# Patient Record
Sex: Female | Born: 1995 | Race: Black or African American | Hispanic: No | Marital: Single | Smoking: Current every day smoker
Health system: Southern US, Community
[De-identification: ages and names within clinical notes are randomized; demographics above are authoritative.]

## PROBLEM LIST (undated history)

## (undated) DIAGNOSIS — J45909 Unspecified asthma, uncomplicated: Secondary | ICD-10-CM

## (undated) HISTORY — PX: APPENDECTOMY: SHX54

---

## 2015-02-15 ENCOUNTER — Emergency Department (HOSPITAL_COMMUNITY): Payer: Medicaid Other | Admitting: Anesthesiology

## 2015-02-15 ENCOUNTER — Inpatient Hospital Stay (HOSPITAL_BASED_OUTPATIENT_CLINIC_OR_DEPARTMENT_OTHER)
Admission: EM | Admit: 2015-02-15 | Discharge: 2015-02-20 | DRG: 339 | Disposition: A | Discharge status: Home / Self Care | Payer: Medicaid Other | Attending: Surgery | Admitting: Surgery

## 2015-02-15 ENCOUNTER — Encounter (HOSPITAL_COMMUNITY): Admission: EM | Disposition: A | Discharge status: Home / Self Care | Payer: Self-pay | Source: Home / Self Care

## 2015-02-15 ENCOUNTER — Encounter (HOSPITAL_BASED_OUTPATIENT_CLINIC_OR_DEPARTMENT_OTHER): Payer: Self-pay | Admitting: *Deleted

## 2015-02-15 ENCOUNTER — Emergency Department (HOSPITAL_BASED_OUTPATIENT_CLINIC_OR_DEPARTMENT_OTHER): Payer: Medicaid Other

## 2015-02-15 DIAGNOSIS — K353 Acute appendicitis with localized peritonitis: Principal | ICD-10-CM | POA: Diagnosis present

## 2015-02-15 DIAGNOSIS — K3532 Acute appendicitis with perforation and localized peritonitis, without abscess: Secondary | ICD-10-CM | POA: Diagnosis present

## 2015-02-15 DIAGNOSIS — D72829 Elevated white blood cell count, unspecified: Secondary | ICD-10-CM

## 2015-02-15 DIAGNOSIS — K567 Ileus, unspecified: Secondary | ICD-10-CM | POA: Diagnosis not present

## 2015-02-15 DIAGNOSIS — K37 Unspecified appendicitis: Secondary | ICD-10-CM | POA: Diagnosis present

## 2015-02-15 HISTORY — PX: LAPAROSCOPIC APPENDECTOMY: SHX408

## 2015-02-15 HISTORY — DX: Unspecified asthma, uncomplicated: J45.909

## 2015-02-15 LAB — CBC
HCT: 30.1 % — ABNORMAL LOW (ref 36.0–46.0)
Hemoglobin: 10.1 g/dL — ABNORMAL LOW (ref 12.0–15.0)
MCH: 28.1 pg (ref 26.0–34.0)
MCHC: 33.6 g/dL (ref 30.0–36.0)
MCV: 83.6 fL (ref 78.0–100.0)
Platelets: 209 10*3/uL (ref 150–400)
RBC: 3.6 MIL/uL — AB (ref 3.87–5.11)
RDW: 16.3 % — ABNORMAL HIGH (ref 11.5–15.5)
WBC: 13.1 10*3/uL — ABNORMAL HIGH (ref 4.0–10.5)

## 2015-02-15 LAB — BASIC METABOLIC PANEL
Anion gap: 1 — ABNORMAL LOW (ref 5–15)
BUN: 6 mg/dL (ref 6–23)
CO2: 25 mmol/L (ref 19–32)
Calcium: 8.3 mg/dL — ABNORMAL LOW (ref 8.4–10.5)
Chloride: 104 mmol/L (ref 96–112)
Creatinine, Ser: 0.7 mg/dL (ref 0.50–1.10)
GFR calc Af Amer: >90 mL/min (ref >90)
GLUCOSE: 108 mg/dL — AB (ref 70–99)
POTASSIUM: 3.6 mmol/L (ref 3.5–5.1)
SODIUM: 130 mmol/L — AB (ref 135–145)

## 2015-02-15 LAB — URINALYSIS, ROUTINE W REFLEX MICROSCOPIC
BILIRUBIN URINE: NEGATIVE
Glucose, UA: NEGATIVE mg/dL
KETONES UR: 15 mg/dL — AB
Leukocytes, UA: NEGATIVE
Nitrite: NEGATIVE
Protein, ur: NEGATIVE mg/dL
Specific Gravity, Urine: 1.015 (ref 1.005–1.030)
Urobilinogen, UA: 1 mg/dL (ref 0.0–1.0)
pH: 6 (ref 5.0–8.0)

## 2015-02-15 LAB — WET PREP, GENITAL
Clue Cells Wet Prep HPF POC: NONE SEEN
Trich, Wet Prep: NONE SEEN
Yeast Wet Prep HPF POC: NONE SEEN

## 2015-02-15 LAB — URINE MICROSCOPIC-ADD ON

## 2015-02-15 LAB — PREGNANCY, URINE: Preg Test, Ur: NEGATIVE

## 2015-02-15 SURGERY — APPENDECTOMY, LAPAROSCOPIC
Anesthesia: General | Site: Abdomen

## 2015-02-15 MED ORDER — DEXAMETHASONE SODIUM PHOSPHATE 10 MG/ML IJ SOLN
INTRAMUSCULAR | Status: AC
  Filled 2015-02-15: qty 1

## 2015-02-15 MED ORDER — BUPIVACAINE HCL (PF) 0.25 % IJ SOLN
INTRAMUSCULAR | Status: AC
  Filled 2015-02-15: qty 30

## 2015-02-15 MED ORDER — PIPERACILLIN-TAZOBACTAM 3.375 G IVPB
3.3750 g | Freq: Once | INTRAVENOUS | Status: AC
  Administered 2015-02-15: 3.375 g via INTRAVENOUS
  Filled 2015-02-15: qty 50

## 2015-02-15 MED ORDER — ONDANSETRON HCL 4 MG/2ML IJ SOLN
INTRAMUSCULAR | Status: AC
  Filled 2015-02-15: qty 2

## 2015-02-15 MED ORDER — MORPHINE SULFATE 4 MG/ML IJ SOLN
4.0000 mg | Freq: Once | INTRAMUSCULAR | Status: AC
  Administered 2015-02-15: 4 mg via INTRAVENOUS
  Filled 2015-02-15: qty 1

## 2015-02-15 MED ORDER — FENTANYL CITRATE 0.05 MG/ML IJ SOLN
INTRAMUSCULAR | Status: AC
  Filled 2015-02-15: qty 2

## 2015-02-15 MED ORDER — PROPOFOL 10 MG/ML IV BOLUS
INTRAVENOUS | Status: AC
  Filled 2015-02-15: qty 20

## 2015-02-15 MED ORDER — HYDROMORPHONE HCL 1 MG/ML IJ SOLN
1.0000 mg | Freq: Once | INTRAMUSCULAR | Status: AC
  Administered 2015-02-15: 1 mg via INTRAVENOUS
  Filled 2015-02-15: qty 1

## 2015-02-15 MED ORDER — SUCCINYLCHOLINE CHLORIDE 20 MG/ML IJ SOLN
INTRAMUSCULAR | Status: DC | PRN
  Administered 2015-02-15: 100 mg via INTRAVENOUS

## 2015-02-15 MED ORDER — PROPOFOL 10 MG/ML IV BOLUS
INTRAVENOUS | Status: DC | PRN
  Administered 2015-02-15: 150 mg via INTRAVENOUS

## 2015-02-15 MED ORDER — GLYCOPYRROLATE 0.2 MG/ML IJ SOLN
INTRAMUSCULAR | Status: AC
  Filled 2015-02-15: qty 3

## 2015-02-15 MED ORDER — LACTATED RINGERS IR SOLN
Status: DC | PRN
Start: 2015-02-15 — End: 2015-02-16
  Administered 2015-02-15: 2000 mL

## 2015-02-15 MED ORDER — SODIUM CHLORIDE 0.9 % IV BOLUS (SEPSIS)
1000.0000 mL | Freq: Once | INTRAVENOUS | Status: AC
  Administered 2015-02-15: 1000 mL via INTRAVENOUS

## 2015-02-15 MED ORDER — ONDANSETRON HCL 4 MG/2ML IJ SOLN
4.0000 mg | Freq: Once | INTRAMUSCULAR | Status: AC
  Administered 2015-02-15: 4 mg via INTRAVENOUS
  Filled 2015-02-15: qty 2

## 2015-02-15 MED ORDER — IOHEXOL 300 MG/ML  SOLN
100.0000 mL | Freq: Once | INTRAMUSCULAR | Status: AC | PRN
  Administered 2015-02-15: 100 mL via INTRAVENOUS

## 2015-02-15 MED ORDER — ONDANSETRON HCL 4 MG/2ML IJ SOLN
INTRAMUSCULAR | Status: DC | PRN
  Administered 2015-02-15: 4 mg via INTRAVENOUS

## 2015-02-15 MED ORDER — ACETAMINOPHEN 10 MG/ML IV SOLN
INTRAVENOUS | Status: DC | PRN
  Administered 2015-02-15: 1000 mg via INTRAVENOUS

## 2015-02-15 MED ORDER — CISATRACURIUM BESYLATE 20 MG/10ML IV SOLN
INTRAVENOUS | Status: AC
  Filled 2015-02-15: qty 10

## 2015-02-15 MED ORDER — CISATRACURIUM BESYLATE (PF) 10 MG/5ML IV SOLN
INTRAVENOUS | Status: DC | PRN
  Administered 2015-02-15: 2 mg via INTRAVENOUS
  Administered 2015-02-15: 4 mg via INTRAVENOUS
  Administered 2015-02-15: 2 mg via INTRAVENOUS

## 2015-02-15 MED ORDER — LACTATED RINGERS IV SOLN
INTRAVENOUS | Status: DC | PRN
  Administered 2015-02-15 – 2015-02-16 (×2): via INTRAVENOUS

## 2015-02-15 MED ORDER — FENTANYL CITRATE 0.05 MG/ML IJ SOLN
INTRAMUSCULAR | Status: DC | PRN
  Administered 2015-02-15 – 2015-02-16 (×3): 50 ug via INTRAVENOUS
  Administered 2015-02-16 (×2): 25 ug via INTRAVENOUS

## 2015-02-15 MED ORDER — DEXAMETHASONE SODIUM PHOSPHATE 10 MG/ML IJ SOLN
INTRAMUSCULAR | Status: DC | PRN
  Administered 2015-02-15: 10 mg via INTRAVENOUS

## 2015-02-15 MED ORDER — IOHEXOL 300 MG/ML  SOLN
25.0000 mL | Freq: Once | INTRAMUSCULAR | Status: AC | PRN
  Administered 2015-02-15: 25 mL via ORAL

## 2015-02-15 MED ORDER — 0.9 % SODIUM CHLORIDE (POUR BTL) OPTIME
TOPICAL | Status: DC | PRN
  Administered 2015-02-15: 1000 mL

## 2015-02-15 SURGICAL SUPPLY — 40 items
APPLIER CLIP ROT 10 11.4 M/L (STAPLE)
BENZOIN TINCTURE PRP APPL 2/3 (GAUZE/BANDAGES/DRESSINGS) ×3 IMPLANT
CLIP APPLIE ROT 10 11.4 M/L (STAPLE) IMPLANT
CLOSURE WOUND 1/2 X4 (GAUZE/BANDAGES/DRESSINGS) ×1
CUTTER FLEX LINEAR 45M (STAPLE) ×3 IMPLANT
DECANTER SPIKE VIAL GLASS SM (MISCELLANEOUS) ×3 IMPLANT
DRAIN CHANNEL 19F RND (DRAIN) ×3 IMPLANT
DRAPE LAPAROSCOPIC ABDOMINAL (DRAPES) ×3 IMPLANT
ELECT REM PT RETURN 9FT ADLT (ELECTROSURGICAL) ×3
ELECTRODE REM PT RTRN 9FT ADLT (ELECTROSURGICAL) ×1 IMPLANT
ENDOLOOP SUT PDS II  0 18 (SUTURE)
ENDOLOOP SUT PDS II 0 18 (SUTURE) IMPLANT
EVACUATOR SILICONE 100CC (DRAIN) ×3 IMPLANT
GLOVE BIOGEL PI IND STRL 7.0 (GLOVE) ×1 IMPLANT
GLOVE BIOGEL PI INDICATOR 7.0 (GLOVE) ×2
GLOVE SURG SIGNA 7.5 PF LTX (GLOVE) ×3 IMPLANT
GOWN SPEC L4 XLG W/TWL (GOWN DISPOSABLE) ×3 IMPLANT
GOWN STRL REUS W/ TWL XL LVL3 (GOWN DISPOSABLE) ×3 IMPLANT
GOWN STRL REUS W/TWL LRG LVL3 (GOWN DISPOSABLE) ×3 IMPLANT
GOWN STRL REUS W/TWL XL LVL3 (GOWN DISPOSABLE) ×6
KIT BASIN OR (CUSTOM PROCEDURE TRAY) ×3 IMPLANT
LIQUID BAND (GAUZE/BANDAGES/DRESSINGS) IMPLANT
PENCIL BUTTON HOLSTER BLD 10FT (ELECTRODE) IMPLANT
POUCH SPECIMEN RETRIEVAL 10MM (ENDOMECHANICALS) ×3 IMPLANT
RELOAD 45 VASCULAR/THIN (ENDOMECHANICALS) IMPLANT
RELOAD STAPLE TA45 3.5 REG BLU (ENDOMECHANICALS) IMPLANT
SET IRRIG TUBING LAPAROSCOPIC (IRRIGATION / IRRIGATOR) ×3 IMPLANT
SHEARS HARMONIC ACE PLUS 36CM (ENDOMECHANICALS) ×3 IMPLANT
SOLUTION ANTI FOG 6CC (MISCELLANEOUS) ×3 IMPLANT
STRIP CLOSURE SKIN 1/2X4 (GAUZE/BANDAGES/DRESSINGS) ×2 IMPLANT
SUT ETHILON 2 0 PS N (SUTURE) ×3 IMPLANT
SUT MON AB 5-0 PS2 18 (SUTURE) ×3 IMPLANT
TOWEL OR 17X26 10 PK STRL BLUE (TOWEL DISPOSABLE) ×3 IMPLANT
TOWEL OR NON WOVEN STRL DISP B (DISPOSABLE) ×3 IMPLANT
TRAY FOLEY CATH 14FRSI W/METER (CATHETERS) ×3 IMPLANT
TRAY LAPAROSCOPIC (CUSTOM PROCEDURE TRAY) ×3 IMPLANT
TROCAR BLADELESS OPT 5 100 (ENDOMECHANICALS) ×3 IMPLANT
TROCAR XCEL BLUNT TIP 100MML (ENDOMECHANICALS) ×3 IMPLANT
TROCAR XCEL NON-BLD 11X100MML (ENDOMECHANICALS) ×3 IMPLANT
TUBING INSUFFLATION 10FT LAP (TUBING) ×3 IMPLANT

## 2015-02-15 NOTE — ED Notes (Signed)
Pt in CT.

## 2015-02-15 NOTE — H&P (Signed)
Re:   Jeily Guthridge DOB:   [birth year] MRN:   [id number]   [facility] admission  ASSESSMENT AND PLAN: 1.  Ruptured appendicitis - phlegmon in RLQ/right pelvis.  I discussed with the patient the indications and risks of appendiceal surgery.  The primary risks of appendiceal surgery include, but are not limited to, bleeding, infection, bowel surgery, and open surgery.  There is also the risk that the patient may have continued symptoms after surgery.   We discussed the typical post-operative recovery course. I tried to answer the patient's questions.  I discussed that the appendix appears ruptured on the CT scan.  I may only be able to drain the abscess and not remove the appendix which could require another operation.  I reviewed the films with Richarda Overlie for possible IR - he thought collection was not amendable to perc drain.  Chief Complaint  Patient presents with  . Abdominal Pain   REFERRING PHYSICIAN:  Dr. Jerelyn Scott, [facility]  HISTORY OF PRESENT ILLNESS: Maiya Kates is a 19 y.o. (DOB: 1996-10-15)  AA  female whose primary care physician is [facility] Not In System and comes to [facility] today for abdominal pain. Her mother, Jobie Quaker, is with her.  I was called by Dr. Casper Harrison at [facility] for Ms. Bernick.  The patient started having pain Sunday, 02/11/2015.  She had nausea early with her symptoms.  She went to the [facility] on Monday, 2/22.  They thought her pain was gynecologic.  They referred her to Dr. Earleen Newport, GYN, near [facility].  It sounds like he did an [place], but was unsure of her diagnosis and gave her pain meds.  She kept hurting and came to [facility] today - and was worked up by Dr. Karma Ganja.  She has no GI history.  She has had no prior abdominal surgery.  Her last period was last Thursday, 2/18.  She has never been pregnant.  CT of abdomen - 02/15/2015 - Inflammatory mass in the RIGHT lower quadrant with a calcification  posteriorly, most compatible with ruptured appendicitis and abscess. The inflammatory mass measures about 6 cm and is immediately adjacent to the cecum   Past Medical History  Diagnosis Date  . Asthma      History reviewed. No pertinent past surgical history.    Current Facility-Administered Medications  Medication Dose Route Frequency Provider Last Rate Last Dose  . piperacillin-tazobactam (ZOSYN) IVPB 3.375 g  3.375 g Intravenous Once Ethelda Chick, MD 12.5 mL/hr at 02/15/15 2040 3.375 g at 02/15/15 2040   No current outpatient prescriptions on file.     No Known Allergies  REVIEW OF SYSTEMS: Skin:  No history of rash.  No history of abnormal moles. Infection:  No history of hepatitis or HIV.  No history of MRSA. Neurologic:  No history of stroke.  No history of seizure.  No history of headaches. Cardiac:  No history of hypertension. No history of heart disease.  No history of prior cardiac catheterization.  No history of seeing a cardiologist. Pulmonary:  History of asthma,   But she has grown out of this. Endocrine:  No diabetes. No thyroid disease. Gastrointestinal:  No history of stomach disease.  No history of liver disease.  No history of gall bladder disease.  No history of pancreas disease.  No history of colon disease. Urologic:  No history of kidney stones.  No history of bladder  infections. Musculoskeletal:  No history of joint or back disease. Hematologic:  No bleeding disorder.  No history of anemia.  Not anticoagulated. Psycho-social:  The patient is oriented.   The patient has no obvious psychologic or social impairment to understanding our conversation and plan.  SOCIAL and FAMILY HISTORY: Her mother, Jobie QuakerStacie Smith, is with her. She is unmarried and lives with mom. She finished high school, but is not in school or working right now.  PHYSICAL EXAM: BP 130/65 mmHg  Pulse 87  Temp(Src) 98.5 F (36.9 C) (Oral)  Resp 16  SpO2 100%  LMP  02/08/2015  General: WN AA F who is alert and generally healthy appearing.  HEENT: Normal. Pupils equal.  Nose ring in right nares. Neck: Supple. No mass.  No thyroid mass. Lymph Nodes:  No supraclavicular or cervical nodes. Lungs: Clear to auscultation and symmetric breath sounds. Heart:  RRR. No murmur or rub. Breast:  Bilateral nipple rings.  Abdomen: Soft. No mass. No hernia. Decreased bowel sounds.  No abdominal scars.  Very tender in RLQ. Rectal: Not done. Extremities:  Good strength and ROM  in upper and lower extremities. Neurologic:  Grossly intact to motor and sensory function. Psychiatric: Has normal mood and affect. Behavior is normal.   DATA REVIEWED: Epic notes.  Ovidio Kinavid Deloy Archey, MD,  [facility] [address]  [address]   [place], [place]    [id number] Phone:  [phone] FAX:  [phone]

## 2015-02-15 NOTE — ED Provider Notes (Signed)
CSN: [id number]     Arrival date & time 02/15/15  1726 History  This chart was scribed for Ethelda ChickMartha K Linker, MD by Freida Busmaniana Omoyeni, ED Scribe. This patient was seen in room MH05/MH05 and the patient's care was started 6:11 PM.    Chief Complaint  Patient presents with  . Abdominal Pain    Patient is a 19 y.o. female presenting with abdominal pain. The history is provided by the patient. No language interpreter was used.  Abdominal Pain Pain location:  RLQ Pain quality: throbbing   Pain severity:  Moderate Progression:  Worsening Chronicity:  Recurrent Associated symptoms: constipation and nausea   Associated symptoms: no dysuria, no fever, no hematuria and no vomiting      HPI Comments:  Vanessa Cochran is a 19 y.o. female who presents to the Emergency Department complaining of constant throbbing RLQ to mid lower abdominal pain that started 5 days ago. She reports associated nausea. She denies urinary symptoms, fever and vomiting. She also denies a h/o abdominal cysts. She has had the same pain the past and has been evaluated by her OB and at [facility] for the same. She states she has had multiple abdominal ultrasounds that only showed a "thick uterus". Her last ultrasound was 3 days ago. Marland Kitchen. Her last episode was in October 2015 that resolved after taking ibuprofen. She started pain medication 3 days ago with minimal relief after initial doses but no relief today. Her last BM was 5 days ago; she denies a h/o constipation.no vaginal bleeding or discharge.  Denies dysruia.    Past Medical History  Diagnosis Date  . Asthma    Past Surgical History  Procedure Laterality Date  . Laparoscopic appendectomy N/A 02/15/2015    Procedure: APPENDECTOMY LAPAROSCOPIC,drainage of abcess;  Surgeon: Ovidio Kinavid Newman, MD;  Location: [facility];  Service: General;  Laterality: N/A;   No family history on file. History  Substance Use Topics  . Smoking status: Never Smoker   . Smokeless tobacco: Not on  file  . Alcohol Use: No   OB History    No data available     Review of Systems  Constitutional: Negative for fever.  Gastrointestinal: Positive for nausea, abdominal pain and constipation. Negative for vomiting.  Genitourinary: Negative for dysuria, frequency, hematuria and difficulty urinating.  All other systems reviewed and are negative.     Allergies  Review of patient's allergies indicates no known allergies.  Home Medications   Prior to Admission medications   Medication Sig Start Date End Date Taking? Authorizing Provider  oxyCODONE-acetaminophen (PERCOCET/ROXICET) 5-325 MG per tablet Take 2 tablets by mouth every 4 (four) hours as needed for severe pain.   Yes Historical Provider, MD   BP 109/53 mmHg  Pulse 77  Temp(Src) 98.2 F (36.8 C) (Oral)  Resp 18  Ht 5\' 10"  (1.778 m)  Wt 172 lb (78.019 kg)  BMI 24.68 kg/m2  SpO2 100%  LMP 02/08/2015  Vitals reviewed Physical Exam  Physical Examination: General appearance - alert, well appearing, and in no distress Mental status - alert, oriented to person, place, and time Eyes - no conjunctival injection, no scleral icterus Mouth - mucous membranes moist, pharynx normal without lesions Chest - clear to auscultation, no wheezes, rales or rhonchi, symmetric air entry Heart - normal rate, regular rhythm, normal S1, S2, no murmurs, rubs, clicks or gallops Abdomen - soft, ttp in right lower abdomen, some gaurding, no rebound, nondistended, no masses or organomegaly Pelvic - normal external genitalia,  vulva, vagina, cervix, uterus and adnexa, no CMT or adnexal tenderness Extremities - peripheral pulses normal, no pedal edema, no clubbing or cyanosis Skin - normal coloration and turgor, no rashes  ED Course  Procedures   DIAGNOSTIC STUDIES:  Oxygen Saturation is 100% on RA, normal by my interpretation.    COORDINATION OF CARE:  6:21 PM Will do a pelvic exam and order labs. Discussed treatment plan with pt at bedside  and pt agreed to plan.  8:34 PM CT scan findings d/w patient and mother at bedside.  D/w Dr. Ezzard Standing, surgery, pt to be transferred to [facility] ED.  D/w Dr. Anitra Lauth in ED there and she is aware of patient transfer.     Labs Review Labs Reviewed  WET PREP, GENITAL - Abnormal; Notable for the following:    WBC, Wet Prep HPF POC FEW (*)    All other components within normal limits  URINALYSIS, ROUTINE W REFLEX MICROSCOPIC - Abnormal; Notable for the following:    APPearance CLOUDY (*)    Hgb urine dipstick SMALL (*)    Ketones, ur 15 (*)    All other components within normal limits  URINE MICROSCOPIC-ADD ON - Abnormal; Notable for the following:    Squamous Epithelial / LPF FEW (*)    Bacteria, UA FEW (*)    All other components within normal limits  CBC - Abnormal; Notable for the following:    WBC 13.1 (*)    RBC 3.60 (*)    Hemoglobin 10.1 (*)    HCT 30.1 (*)    RDW 16.3 (*)    All other components within normal limits  BASIC METABOLIC PANEL - Abnormal; Notable for the following:    Sodium 130 (*)    Glucose, Bld 108 (*)    Calcium 8.3 (*)    Anion gap 1 (*)    All other components within normal limits  PREGNANCY, URINE  GC/CHLAMYDIA PROBE AMP ([place])    Imaging Review Ct Abdomen Pelvis W Contrast  02/15/2015   CLINICAL DATA:  RIGHT lower quadrant pain since Sunday. Nausea. Abdominal pain. Initial encounter.  EXAM: CT ABDOMEN AND PELVIS WITH CONTRAST  TECHNIQUE: Multidetector CT imaging of the abdomen and pelvis was performed using the standard protocol following bolus administration of intravenous contrast.  CONTRAST:  OMNIPAQUE IOHEXOL 300 MG/ML  SOLN  COMPARISON:  None.  FINDINGS: Musculoskeletal:  Normal.  Lung Bases: Normal.  Liver:  Normal.  Spleen:  Normal.  Gallbladder:  Normal.  Common bile duct:  Normal.  Pancreas:  Normal.  Adrenal glands:  Normal bilaterally.  Kidneys: Normal enhancement. Ureters are suboptimally visualized due to paucity of abdominal  fat but grossly appear within normal limits.  Stomach:  Normal.  Small bowel: Proximal small bowel is normal. There is inflammation of the distal small bowel in the RIGHT lower quadrant. This is centered around the ileocecal region. Diffuse thickening of the cecum and loops of distal small bowel without obstruction. Marked fat stranding is present. There is a heterogeneous fluid collection inferior to the cecum with multiple air-fluid levels. This appears different from adjacent bowel loops and the appearance is most compatible with a periappendiceal abscess from appendiceal rupture. Small calcification is present in the posterior portion of the inflammatory mass compatible with an appendicolith from a ruptured appendix. The mass on axial imaging measures 5.1 cm x 6.0 cm.  Colon: Ascending colon, transverse colon and descending colon appear normal.  Pelvic Genitourinary: Fluid is present in the endometrium,  likely related to phase of cycle. Correlating with the prior ultrasound, this is probably unchanged. Adjacent to the RIGHT side of the urinary bladder, there is an area of fluid which cannot be distinguished From adjacent bowel. It is difficult to exclude another abscess in this location, particularly since contrast has not reached the distal small bowel.  Peritoneum: There is no free air in the abdomen. Reactive ascites in the anatomic pelvis.  Vasculature: Normal.  Body Wall: Normal.  IMPRESSION: Inflammatory mass in the RIGHT lower quadrant with a calcification posteriorly, most compatible with ruptured appendicitis and abscess. The inflammatory mass measures about 6 cm and is immediately adjacent to the cecum (image 22 series 5).   Electronically Signed   By: Andreas Newport M.D.   On: 02/15/2015 20:03     EKG Interpretation None      MDM   Final diagnoses:  Ruptured suppurative appendicitis  Leukocytosis    Pt presening with RLQ pain for the past 5 days.  Had pelvic ultrasound 2 days go via  GYN which did not reveal the cause of pain.  CT scan today shows ruptured appendicitis.  Pt started on zosyn per sugery recs, pt to be seen by them at [facility].    I personally performed the services described in this documentation, which was scribed in my presence. The recorded information has been reviewed and is accurate.      Ethelda Chick, MD 02/17/15 [phone]

## 2015-02-15 NOTE — ED Notes (Signed)
Patient went to OR.

## 2015-02-15 NOTE — ED Notes (Signed)
Pt alert, NAD, calm, interactive, resps e/u, speaking in clear complete sentences, sipping on PO contrast, NSL flushed/ infusing well.

## 2015-02-15 NOTE — ED Notes (Signed)
Belongings with mother. piercings (navel and nose) removed. Nipple piercings remain in place. Denies oral piercings.

## 2015-02-15 NOTE — Anesthesia Procedure Notes (Signed)
Procedure Name: Intubation Date/Time: 02/15/2015 11:10 PM Performed by: Delphia GratesHANDLER, Antwan Pandya Pre-anesthesia Checklist: Emergency Drugs available, Patient identified, Suction available and Patient being monitored Patient Re-evaluated:Patient Re-evaluated prior to inductionOxygen Delivery Method: Circle system utilized Preoxygenation: Pre-oxygenation with 100% oxygen Intubation Type: IV induction and Rapid sequence Grade View: Grade I Tube type: Oral Tube size: 7.5 mm Number of attempts: 1 Airway Equipment and Method: Stylet Placement Confirmation: ETT inserted through vocal cords under direct vision and breath sounds checked- equal and bilateral Secured at: 21 cm Tube secured with: Tape Dental Injury: Teeth and Oropharynx as per pre-operative assessment

## 2015-02-15 NOTE — ED Notes (Signed)
Lower abdominal pain since Sunday. Feels like a throbbing pain.

## 2015-02-15 NOTE — Anesthesia Preprocedure Evaluation (Signed)
Anesthesia Evaluation  Patient identified by MRN, date of birth, ID band Patient awake    Reviewed: Allergy & Precautions, NPO status , Patient's Chart, lab work & pertinent test results  History of Anesthesia Complications Negative for: history of anesthetic complications  Airway Mallampati: II  TM Distance: >3 FB Neck ROM: Full    Dental no notable dental hx. (+) Dental Advisory Given   Pulmonary asthma ,  breath sounds clear to auscultation  Pulmonary exam normal       Cardiovascular Exercise Tolerance: Good negative cardio ROS  Rhythm:Regular Rate:Normal     Neuro/Psych negative neurological ROS  negative psych ROS   GI/Hepatic negative GI ROS, Neg liver ROS,   Endo/Other  negative endocrine ROS  Renal/GU negative Renal ROS  negative genitourinary   Musculoskeletal negative musculoskeletal ROS (+)   Abdominal   Peds negative pediatric ROS (+)  Hematology negative hematology ROS (+)   Anesthesia Other Findings   Reproductive/Obstetrics negative OB ROS                             Anesthesia Physical Anesthesia Plan  ASA: II  Anesthesia Plan: General   Post-op Pain Management:    Induction: Intravenous, Rapid sequence and Cricoid pressure planned  Airway Management Planned: Oral ETT  Additional Equipment:   Intra-op Plan:   Post-operative Plan: Extubation in OR  Informed Consent: I have reviewed the patients History and Physical, chart, labs and discussed the procedure including the risks, benefits and alternatives for the proposed anesthesia with the patient or authorized representative who has indicated his/her understanding and acceptance.   Dental advisory given  Plan Discussed with: CRNA  Anesthesia Plan Comments:         Anesthesia Quick Evaluation

## 2015-02-15 NOTE — ED Notes (Signed)
[facility] here for transport, report given.

## 2015-02-15 NOTE — ED Notes (Signed)
Bed: WA08 Expected date:  Expected time:  Means of arrival:  Comments: Transfer from [facility]

## 2015-02-16 ENCOUNTER — Encounter (HOSPITAL_COMMUNITY): Payer: Self-pay | Admitting: Surgery

## 2015-02-16 DIAGNOSIS — K567 Ileus, unspecified: Secondary | ICD-10-CM | POA: Diagnosis not present

## 2015-02-16 DIAGNOSIS — R1031 Right lower quadrant pain: Secondary | ICD-10-CM | POA: Diagnosis present

## 2015-02-16 DIAGNOSIS — K37 Unspecified appendicitis: Secondary | ICD-10-CM | POA: Diagnosis present

## 2015-02-16 DIAGNOSIS — K3532 Acute appendicitis with perforation and localized peritonitis, without abscess: Secondary | ICD-10-CM | POA: Diagnosis present

## 2015-02-16 DIAGNOSIS — K353 Acute appendicitis with localized peritonitis: Secondary | ICD-10-CM | POA: Diagnosis not present

## 2015-02-16 LAB — GC/CHLAMYDIA PROBE AMP (~~LOC~~) NOT AT ARMC
CHLAMYDIA, DNA PROBE: NEGATIVE
NEISSERIA GONORRHEA: NEGATIVE

## 2015-02-16 MED ORDER — FENTANYL CITRATE 0.05 MG/ML IJ SOLN
INTRAMUSCULAR | Status: AC
  Filled 2015-02-16: qty 2

## 2015-02-16 MED ORDER — IBUPROFEN 200 MG PO TABS
600.0000 mg | ORAL_TABLET | Freq: Four times a day (QID) | ORAL | Status: DC | PRN
  Administered 2015-02-17: 600 mg via ORAL
  Filled 2015-02-16: qty 3

## 2015-02-16 MED ORDER — ONDANSETRON HCL 4 MG/2ML IJ SOLN
4.0000 mg | Freq: Four times a day (QID) | INTRAMUSCULAR | Status: DC | PRN
  Administered 2015-02-17 (×2): 4 mg via INTRAVENOUS
  Filled 2015-02-16 (×2): qty 2

## 2015-02-16 MED ORDER — MORPHINE SULFATE 2 MG/ML IJ SOLN
1.0000 mg | INTRAMUSCULAR | Status: DC | PRN
  Administered 2015-02-16 (×5): 2 mg via INTRAVENOUS
  Administered 2015-02-17: 1 mg via INTRAVENOUS
  Administered 2015-02-17: 2 mg via INTRAVENOUS
  Filled 2015-02-16 (×7): qty 1

## 2015-02-16 MED ORDER — ONDANSETRON HCL 4 MG/2ML IJ SOLN
INTRAMUSCULAR | Status: AC
  Administered 2015-02-16: 4 mg
  Filled 2015-02-16: qty 2

## 2015-02-16 MED ORDER — BUPIVACAINE HCL (PF) 0.25 % IJ SOLN
INTRAMUSCULAR | Status: DC | PRN
  Administered 2015-02-16: 30 mL

## 2015-02-16 MED ORDER — PIPERACILLIN-TAZOBACTAM 3.375 G IVPB
3.3750 g | Freq: Three times a day (TID) | INTRAVENOUS | Status: DC
  Administered 2015-02-16 – 2015-02-20 (×14): 3.375 g via INTRAVENOUS
  Filled 2015-02-16 (×15): qty 50

## 2015-02-16 MED ORDER — FENTANYL CITRATE 0.05 MG/ML IJ SOLN
25.0000 ug | INTRAMUSCULAR | Status: DC | PRN
  Administered 2015-02-16: 50 ug via INTRAVENOUS

## 2015-02-16 MED ORDER — HEPARIN SODIUM (PORCINE) 5000 UNIT/ML IJ SOLN
5000.0000 [IU] | Freq: Three times a day (TID) | INTRAMUSCULAR | Status: DC
  Administered 2015-02-16 – 2015-02-20 (×13): 5000 [IU] via SUBCUTANEOUS
  Filled 2015-02-16 (×16): qty 1

## 2015-02-16 MED ORDER — NEOSTIGMINE METHYLSULFATE 10 MG/10ML IV SOLN
INTRAVENOUS | Status: DC | PRN
  Administered 2015-02-16: 4 mg via INTRAVENOUS

## 2015-02-16 MED ORDER — ONDANSETRON HCL 4 MG/2ML IJ SOLN
4.0000 mg | Freq: Once | INTRAMUSCULAR | Status: DC | PRN
Start: 2015-02-16 — End: 2015-02-16

## 2015-02-16 MED ORDER — KCL IN DEXTROSE-NACL 20-5-0.45 MEQ/L-%-% IV SOLN
INTRAVENOUS | Status: DC
  Administered 2015-02-16: 125 mL/h via INTRAVENOUS
  Administered 2015-02-16: 14:00:00 via INTRAVENOUS
  Administered 2015-02-16 – 2015-02-17 (×2): 125 mL via INTRAVENOUS
  Administered 2015-02-18 (×2): via INTRAVENOUS
  Administered 2015-02-19: 1000 mL via INTRAVENOUS
  Administered 2015-02-19 – 2015-02-20 (×2): via INTRAVENOUS
  Filled 2015-02-16 (×13): qty 1000

## 2015-02-16 MED ORDER — GLYCOPYRROLATE 0.2 MG/ML IJ SOLN
INTRAMUSCULAR | Status: DC | PRN
  Administered 2015-02-16: 0.6 mg via INTRAVENOUS

## 2015-02-16 MED ORDER — HEPARIN SODIUM (PORCINE) 5000 UNIT/ML IJ SOLN
5000.0000 [IU] | Freq: Three times a day (TID) | INTRAMUSCULAR | Status: DC
  Filled 2015-02-16 (×4): qty 1

## 2015-02-16 MED ORDER — HYDROCODONE-ACETAMINOPHEN 5-325 MG PO TABS
1.0000 | ORAL_TABLET | ORAL | Status: DC | PRN
  Administered 2015-02-17: 1 via ORAL
  Administered 2015-02-17: 2 via ORAL
  Filled 2015-02-16: qty 1
  Filled 2015-02-16 (×2): qty 2

## 2015-02-16 NOTE — Op Note (Addendum)
Re:   Vanessa Cochran DOB:   [birth year] MRN:   [id number]                   FACILITY:  [facility]  DATE OF PROCEDURE: 02/16/2015                              OPERATIVE REPORT  PREOPERATIVE DIAGNOSIS:  Appendicitis  POSTOPERATIVE DIAGNOSIS:  Acute appendicitis with perforation and abscess.  [photos in chart]  PROCEDURE:  Laparoscopic appendectomy.  Drainage of abscess.  Retrieval of appendolith.  SURGEON:  Sandria Bales. Ezzard Standing, MD  ASSISTANT:  No first assistant.  ANESTHESIA:  General endotracheal.  Anesthesiologist: Felipe Drone, MD CRNA: Delphia Grates, CRNA  ASA:  2E  ESTIMATED BLOOD LOSS:  Minimal.  DRAINS: none   SPECIMEN:   Appendix  COUNTS CORRECT:  YES  INDICATIONS FOR PROCEDURE: Sharleen Szczesny is a 19 y.o. (DOB: 1996-04-17) AA  female whose primary care doctor is Pcp Not In System and comes to the OR for an appendectomy.   I discussed with the patient, the indications and potential complications of appendiceal surgery.  The potential complications include, but are not limited to, bleeding, open surgery, bowel resection, and the possibility of another diagnosis.  OPERATIVE NOTE:  The patient underwent a general endotracheal anesthetic as supervised by Anesthesiologist: Felipe Drone, MD CRNA: Delphia Grates, CRNA, General, in room #1 at [facility] OR.  The patient was on Zosyn prior to the procedure and the abdomen was prepped with ChloraPrep.  The patient had a foley catheter placed at the beginning of the procedure.  A time-out was held and surgical checklist run.  An infraumbilical incision was made with sharp dissection carried down to the abdominal cavity.  An 12 mm Hasson trocar was inserted through the infraumbilical incision and into the peritoneal cavity.  A 0 degree 10 mm laparoscope was inserted through a 12 mm Hasson trocar and the Hasson trocar secured with a 0 Vicryl suture.  I placed a 5 mm trocar in the right upper quadrant and 11 mm torcar in left lower quadrant  and did abdominal exploration.    The right and left lobes of liver unremarkable.  Stomach was unremarkable.  The pelvic organs were unremarkable.  I saw no other intra-abdominal abnormality.  The patient had an appendiceal abscess located at the right pelvic brim.  The abscess did not involve the right ovary or tube.  It looked like the tip of the appendix was blown out.   I opened and drained the abscess.  I was able to dissect out the appendix. The mesentery of the appendix was divided with a Harmonic scalpel.  I found the appendolith seen on CT scan and removed this.  It was floating in the abscess.  I got to the base of the appendix.  I then used a blue load 45 mm Ethicon Endo-GIA stapler and fired this across the base of the appendix.  I placed the appendix in EndoCatch bag and delivered the bag through the umbilical incision.  I irrigated the abdomen with 2,000 cc of saline.  I placed a 19 F drain in the RLQ with tubing of the drain laying in the right pelvis.  The drain was sewn in place with a 2-0 nylon suture.  After irrigating the abdomen, I then removed the trocars, in turn.  The umbilical port fascia was closed with 0 Vicryl suture.   I closed  the skin each site with a 5-0 Monocryl suture and painted the wounds with Dermabond.  I then injected a total of 30 mL of 0.25% Marcaine at the incisions.  Sponge and needle count were correct at the end of the case.  The foley catheter was removed in the OR.  The patient was transferred to the recovery room in good condition.  The patient tolerated the procedure well and it depends on the patient's post op clinical course as to when the patient could be discharged.  She will need antibiotic coverage for the abscess.   Ovidio Kinavid Nyashia Raney, MD, [facility] Pager: [phone] Office phone:  [phone]

## 2015-02-16 NOTE — Progress Notes (Signed)
UR completed 

## 2015-02-16 NOTE — Transfer of Care (Signed)
Immediate Anesthesia Transfer of Care Note  Patient: Vanessa Cochran  Procedure(s) Performed: Procedure(s): APPENDECTOMY LAPAROSCOPIC,drainage of abcess (N/A)  Patient Location: PACU  Anesthesia Type:General  Level of Consciousness: awake, alert , oriented and patient cooperative  Airway & Oxygen Therapy: Patient Spontanous Breathing and Patient connected to face mask oxygen  Post-op Assessment: Report given to RN and Post -op Vital signs reviewed and stable  Post vital signs: Reviewed and stable  Last Vitals:  Filed Vitals:   02/15/15 2154  BP: 134/61  Pulse: 87  Temp: 36.7 C  Resp: 16    Complications: No apparent anesthesia complications

## 2015-02-16 NOTE — Progress Notes (Signed)
1 Day Post-Op  Subjective: Sore.  Feels bloated.  Objective: Vital signs in last 24 hours: Temp:  [97.8 F (36.6 C)-99.3 F (37.4 C)] 99.3 F (37.4 C) (02/26 1000) Pulse Rate:  [67-102] 102 (02/26 1000) Resp:  [14-18] 18 (02/26 1000) BP: (93-134)/(52-99) 120/66 mmHg (02/26 1000) SpO2:  [99 %-100 %] 100 % (02/26 1000) Weight:  [172 lb (78.019 kg)] 172 lb (78.019 kg) (02/26 0158) Last BM Date: 02/15/15  Intake/Output from previous day: 02/25 0701 - 02/26 0700 In: 3829.6 [P.O.:1340; I.V.:2489.6] Out: 2070 [Urine:1600; Drains:420; Blood:50] Intake/Output this shift: Total I/O In: 526.3 [P.O.:120; I.V.:406.3] Out: 450 [Urine:450]  PE: General- In NAD Abdomen-slightly firm and distended, dressings dry, thin serosanguinous drain output  Lab Results:   Recent Labs  02/15/15 1840  WBC 13.1*  HGB 10.1*  HCT 30.1*  PLT 209   BMET  Recent Labs  02/15/15 1840  NA 130*  K 3.6  CL 104  CO2 25  GLUCOSE 108*  BUN 6  CREATININE 0.70  CALCIUM 8.3*   PT/INR No results for input(s): LABPROT, INR in the last 72 hours. Comprehensive Metabolic Panel:    Component Value Date/Time   NA 130* 02/15/2015 1840   K 3.6 02/15/2015 1840   CL 104 02/15/2015 1840   CO2 25 02/15/2015 1840   BUN 6 02/15/2015 1840   CREATININE 0.70 02/15/2015 1840   GLUCOSE 108* 02/15/2015 1840   CALCIUM 8.3* 02/15/2015 1840     Studies/Results: Ct Abdomen Pelvis W Contrast  02/15/2015   CLINICAL DATA:  RIGHT lower quadrant pain since Sunday. Nausea. Abdominal pain. Initial encounter.  EXAM: CT ABDOMEN AND PELVIS WITH CONTRAST  TECHNIQUE: Multidetector CT imaging of the abdomen and pelvis was performed using the standard protocol following bolus administration of intravenous contrast.  CONTRAST:  OMNIPAQUE IOHEXOL 300 MG/ML  SOLN  COMPARISON:  None.  FINDINGS: Musculoskeletal:  Normal.  Lung Bases: Normal.  Liver:  Normal.  Spleen:  Normal.  Gallbladder:  Normal.  Common bile duct:  Normal.   Pancreas:  Normal.  Adrenal glands:  Normal bilaterally.  Kidneys: Normal enhancement. Ureters are suboptimally visualized due to paucity of abdominal fat but grossly appear within normal limits.  Stomach:  Normal.  Small bowel: Proximal small bowel is normal. There is inflammation of the distal small bowel in the RIGHT lower quadrant. This is centered around the ileocecal region. Diffuse thickening of the cecum and loops of distal small bowel without obstruction. Marked fat stranding is present. There is a heterogeneous fluid collection inferior to the cecum with multiple air-fluid levels. This appears different from adjacent bowel loops and the appearance is most compatible with a periappendiceal abscess from appendiceal rupture. Small calcification is present in the posterior portion of the inflammatory mass compatible with an appendicolith from a ruptured appendix. The mass on axial imaging measures 5.1 cm x 6.0 cm.  Colon: Ascending colon, transverse colon and descending colon appear normal.  Pelvic Genitourinary: Fluid is present in the endometrium, likely related to phase of cycle. Correlating with the prior ultrasound, this is probably unchanged. Adjacent to the RIGHT side of the urinary bladder, there is an area of fluid which cannot be distinguished From adjacent bowel. It is difficult to exclude another abscess in this location, particularly since contrast has not reached the distal small bowel.  Peritoneum: There is no free air in the abdomen. Reactive ascites in the anatomic pelvis.  Vasculature: Normal.  Body Wall: Normal.  IMPRESSION: Inflammatory mass in the RIGHT  lower quadrant with a calcification posteriorly, most compatible with ruptured appendicitis and abscess. The inflammatory mass measures about 6 cm and is immediately adjacent to the cecum (image 22 series 5).   Electronically Signed   By: Andreas NewportGeoffrey  Lamke M.D.   On: 02/15/2015 20:03    Anti-infectives: Anti-infectives    Start      Dose/Rate Route Frequency Ordered Stop   02/16/15 0400  piperacillin-tazobactam (ZOSYN) IVPB 3.375 g     3.375 g 12.5 mL/hr over 240 Minutes Intravenous Every 8 hours 02/16/15 0216     02/15/15 2030  piperacillin-tazobactam (ZOSYN) IVPB 3.375 g     3.375 g 12.5 mL/hr over 240 Minutes Intravenous  Once 02/15/15 2030 02/15/15 2113      Assessment Active Problems:   Perforated appendicitis with abscess s/p lap appy and drainage of abscess (Dr. Ezzard StandingNewman) 02/15/15-stable overnight; at risk for postop ileus      Plan: IV abxs, wait for return of some bowel function, leave drain in   Vanessa Cochran 02/16/2015

## 2015-02-16 NOTE — Anesthesia Postprocedure Evaluation (Signed)
  Anesthesia Post-op Note  Patient: Vanessa Cochran  Procedure(s) Performed: Procedure(s) (LRB): APPENDECTOMY LAPAROSCOPIC,drainage of abcess (N/A)  Patient Location: PACU  Anesthesia Type: General  Level of Consciousness: awake and alert   Airway and Oxygen Therapy: Patient Spontanous Breathing  Post-op Pain: mild  Post-op Assessment: Post-op Vital signs reviewed, Patient's Cardiovascular Status Stable, Respiratory Function Stable, Patent Airway and No signs of Nausea or vomiting  Last Vitals:  Filed Vitals:   02/16/15 1420  BP: 110/61  Pulse: 87  Temp: 37.2 C  Resp: 18    Post-op Vital Signs: stable   Complications: No apparent anesthesia complications

## 2015-02-17 MED ORDER — NAPROXEN 500 MG PO TABS: 500.0000 mg | ORAL_TABLET | Freq: Two times a day (BID) | ORAL | Status: DC

## 2015-02-17 MED ORDER — ACETAMINOPHEN 325 MG PO TABS
325.0000 mg | ORAL_TABLET | Freq: Four times a day (QID) | ORAL | Status: DC | PRN
Start: 2015-02-17 — End: 2015-02-20

## 2015-02-17 MED ORDER — MENTHOL 3 MG MT LOZG: 1.0000 | LOZENGE | OROMUCOSAL | Status: DC | PRN

## 2015-02-17 MED ORDER — LACTATED RINGERS IV BOLUS (SEPSIS): 1000.0000 mL | Freq: Three times a day (TID) | INTRAVENOUS | Status: AC | PRN

## 2015-02-17 MED ORDER — LIP MEDEX EX OINT
1.0000 "application " | TOPICAL_OINTMENT | Freq: Two times a day (BID) | CUTANEOUS | Status: DC
  Administered 2015-02-17 – 2015-02-20 (×4): 1 via TOPICAL

## 2015-02-17 MED ORDER — PROMETHAZINE HCL 25 MG/ML IJ SOLN: 6.2500 mg | INTRAMUSCULAR | Status: DC | PRN

## 2015-02-17 MED ORDER — ALUM & MAG HYDROXIDE-SIMETH 200-200-20 MG/5ML PO SUSP: 30.0000 mL | Freq: Four times a day (QID) | ORAL | Status: DC | PRN

## 2015-02-17 MED ORDER — PHENOL 1.4 % MT LIQD: 2.0000 | OROMUCOSAL | Status: DC | PRN

## 2015-02-17 MED ORDER — MAGIC MOUTHWASH
15.0000 mL | Freq: Four times a day (QID) | ORAL | Status: DC | PRN
  Filled 2015-02-17: qty 15

## 2015-02-17 MED ORDER — ACETAMINOPHEN 650 MG RE SUPP: 650.0000 mg | Freq: Four times a day (QID) | RECTAL | Status: DC | PRN

## 2015-02-17 NOTE — Progress Notes (Addendum)
Pt has had >500cc of clear pale yellow fluid out of her JP drain in the last 12 hrs., MD notified. Will continue to monitor at this time.

## 2015-02-17 NOTE — Progress Notes (Signed)
Patient ID: Vanessa Cochran, female   DOB: Dec 02, 1996, 19 y.o.   MRN: [id number] 2 Days Post-Op  Subjective: Abdomen very sore generally, about the same. No nausea or vomiting. Sipping on some clear liquids. No flatus or bowel movements yet.  Objective: Vital signs in last 24 hours: Temp:  [98.1 F (36.7 C)-99.2 F (37.3 C)] 98.2 F (36.8 C) (02/27 0630) Pulse Rate:  [73-87] 77 (02/27 0630) Resp:  [18] 18 (02/27 0630) BP: (109-115)/(53-66) 109/53 mmHg (02/27 0630) SpO2:  [100 %] 100 % (02/27 0630) Last BM Date: 02/15/15  Intake/Output from previous day: 02/26 0701 - 02/27 0700 In: 2719.2 [P.O.:240; I.V.:2479.2] Out: 2495 [Urine:2400; Drains:95] Intake/Output this shift: Total I/O In: -  Out: 460 [Urine:400; Drains:60]  General appearance: alert, cooperative and no distress GI: moderately distended and tympanitic. Moderate diffuse tenderness without peritoneal signs. JP drainage slightly cloudy serosanguineous Incision/Wound: incisions clean and dry  Lab Results:   Recent Labs  02/15/15 1840  WBC 13.1*  HGB 10.1*  HCT 30.1*  PLT 209   BMET  Recent Labs  02/15/15 1840  NA 130*  K 3.6  CL 104  CO2 25  GLUCOSE 108*  BUN 6  CREATININE 0.70  CALCIUM 8.3*     Studies/Results: Ct Abdomen Pelvis W Contrast  02/15/2015   CLINICAL DATA:  RIGHT lower quadrant pain since Sunday. Nausea. Abdominal pain. Initial encounter.  EXAM: CT ABDOMEN AND PELVIS WITH CONTRAST  TECHNIQUE: Multidetector CT imaging of the abdomen and pelvis was performed using the standard protocol following bolus administration of intravenous contrast.  CONTRAST:  OMNIPAQUE IOHEXOL 300 MG/ML  SOLN  COMPARISON:  None.  FINDINGS: Musculoskeletal:  Normal.  Lung Bases: Normal.  Liver:  Normal.  Spleen:  Normal.  Gallbladder:  Normal.  Common bile duct:  Normal.  Pancreas:  Normal.  Adrenal glands:  Normal bilaterally.  Kidneys: Normal enhancement. Ureters are suboptimally visualized due to paucity of  abdominal fat but grossly appear within normal limits.  Stomach:  Normal.  Small bowel: Proximal small bowel is normal. There is inflammation of the distal small bowel in the RIGHT lower quadrant. This is centered around the ileocecal region. Diffuse thickening of the cecum and loops of distal small bowel without obstruction. Marked fat stranding is present. There is a heterogeneous fluid collection inferior to the cecum with multiple air-fluid levels. This appears different from adjacent bowel loops and the appearance is most compatible with a periappendiceal abscess from appendiceal rupture. Small calcification is present in the posterior portion of the inflammatory mass compatible with an appendicolith from a ruptured appendix. The mass on axial imaging measures 5.1 cm x 6.0 cm.  Colon: Ascending colon, transverse colon and descending colon appear normal.  Pelvic Genitourinary: Fluid is present in the endometrium, likely related to phase of cycle. Correlating with the prior ultrasound, this is probably unchanged. Adjacent to the RIGHT side of the urinary bladder, there is an area of fluid which cannot be distinguished From adjacent bowel. It is difficult to exclude another abscess in this location, particularly since contrast has not reached the distal small bowel.  Peritoneum: There is no free air in the abdomen. Reactive ascites in the anatomic pelvis.  Vasculature: Normal.  Body Wall: Normal.  IMPRESSION: Inflammatory mass in the RIGHT lower quadrant with a calcification posteriorly, most compatible with ruptured appendicitis and abscess. The inflammatory mass measures about 6 cm and is immediately adjacent to the cecum (image 22 series 5).   Electronically Signed   By:  Andreas NewportGeoffrey  Lamke M.D.   On: 02/15/2015 20:03    Anti-infectives: Anti-infectives    Start     Dose/Rate Route Frequency Ordered Stop   02/16/15 0400  piperacillin-tazobactam (ZOSYN) IVPB 3.375 g     3.375 g 12.5 mL/hr over 240 Minutes  Intravenous Every 8 hours 02/16/15 0216     02/15/15 2030  piperacillin-tazobactam (ZOSYN) IVPB 3.375 g     3.375 g 12.5 mL/hr over 240 Minutes Intravenous  Once 02/15/15 2030 02/15/15 2113      Assessment/Plan: s/p Procedure(s): APPENDECTOMY LAPAROSCOPIC,drainage of abcess Perforated appendicitis with abscess. As expected ileus. Continue just sips of liquids. Continue IV antibiotics. Ambulation encouraged.    LOS: 1 day    Kerman Pfost T 02/17/2015

## 2015-02-18 LAB — BASIC METABOLIC PANEL
ANION GAP: 8 (ref 5–15)
BUN: 6 mg/dL (ref 6–23)
CALCIUM: 8.5 mg/dL (ref 8.4–10.5)
CO2: 22 mmol/L (ref 19–32)
Chloride: 106 mmol/L (ref 96–112)
Creatinine, Ser: 0.68 mg/dL (ref 0.50–1.10)
GFR calc Af Amer: >90 mL/min (ref >90)
GFR calc non Af Amer: >90 mL/min (ref >90)
Glucose, Bld: 109 mg/dL — ABNORMAL HIGH (ref 70–99)
POTASSIUM: 4 mmol/L (ref 3.5–5.1)
SODIUM: 136 mmol/L (ref 135–145)

## 2015-02-18 LAB — CBC
HCT: 29.5 % — ABNORMAL LOW (ref 36.0–46.0)
Hemoglobin: 9.5 g/dL — ABNORMAL LOW (ref 12.0–15.0)
MCH: 27.5 pg (ref 26.0–34.0)
MCHC: 32.2 g/dL (ref 30.0–36.0)
MCV: 85.3 fL (ref 78.0–100.0)
Platelets: 260 10*3/uL (ref 150–400)
RBC: 3.46 MIL/uL — AB (ref 3.87–5.11)
RDW: 17.6 % — ABNORMAL HIGH (ref 11.5–15.5)
WBC: 8.2 10*3/uL (ref 4.0–10.5)

## 2015-02-18 MED ORDER — DIPHENHYDRAMINE HCL 50 MG/ML IJ SOLN: 12.5000 mg | Freq: Four times a day (QID) | INTRAMUSCULAR | Status: DC | PRN

## 2015-02-18 MED ORDER — LACTATED RINGERS IV BOLUS (SEPSIS)
1000.0000 mL | Freq: Three times a day (TID) | INTRAVENOUS | Status: AC | PRN
Start: 2015-02-18 — End: 2015-02-20

## 2015-02-18 MED ORDER — METOCLOPRAMIDE HCL 5 MG/ML IJ SOLN: 5.0000 mg | Freq: Four times a day (QID) | INTRAMUSCULAR | Status: DC | PRN

## 2015-02-18 MED ORDER — TRAMADOL HCL 50 MG PO TABS
50.0000 mg | ORAL_TABLET | Freq: Four times a day (QID) | ORAL | Status: DC | PRN
  Administered 2015-02-18 – 2015-02-19 (×2): 50 mg via ORAL
  Filled 2015-02-18 (×2): qty 1

## 2015-02-18 MED ORDER — BISACODYL 10 MG RE SUPP: 10.0000 mg | Freq: Two times a day (BID) | RECTAL | Status: DC | PRN

## 2015-02-18 NOTE — Progress Notes (Signed)
[facility]  [address] [place], [place] [address] Phone: [phone] FAX: [phone]    Vanessa Cochran [id number] 07-14-1996  CARE TEAM:  Vanessa: Vanessa Cochran  Outpatient Care Team: Patient Care Team: Vanessa Cochran as Vanessa - General Vanessa Ores, MD as Referring Physician (Obstetrics and Gynecology)  Inpatient Treatment Team: Treatment Team: Attending Provider: Nolon Nations, MD; Technician: Vanessa Cochran, NT; Registered Nurse: Vanessa Savage, RN  Problem List:   Active Problems:   Appendicitis   Perforated appendicitis   3 Days Post-Op  Procedure(s):  POSTOPERATIVE DIAGNOSIS: Acute appendicitis with perforation and abscess. [photos in chart]  PROCEDURE: Laparoscopic appendectomy. Drainage of abscess. Retrieval of [place].  SURGEON: Vanessa Cochran. Vanessa Gaskins, MD  Assessment  Ileus with perf appendiicitis  Plan:  -sips only until flatus.  NGT if worse -IV ABx - WBC less a hopeful sign -Drain output high prob from edema/ ileus.  SEROUS.  Continue for now -CT scan if not improving/worse POD#5-7.  WBC less & pain less = hopefully not -nausea control -VTE prophylaxis- SCDs, etc -mobilize as tolerated to help recovery.  GET HER UP!  I updated the patient's status to the patient, parents, and RN.  Recommendations were made.  Questions were answered.  They expressed understanding & appreciation.   Vanessa Cochran, M.D., F.A.C.S. Gastrointestinal and Minimally Invasive Surgery [facility] [address], [place] [address] [place], [place] [address] [phone] Main / Paging   02/18/2015  Subjective:  Tol liquids a little but did have emesis x1 yesterday Denies pain Parents in room Concern for drain output  Objective:  Vital signs:  Filed Vitals:   02/17/15 0630 02/17/15 1400 02/17/15 2240 02/18/15 0430  BP: 109/53 121/71 116/72 120/74  Pulse: 77 77 72 74  Temp: 98.2 F (36.8 C)  98.3 F (36.8 C) 98.5 F (36.9 C) 98.2 F (36.8 C)  TempSrc: Oral Oral Oral Oral  Resp: '18 18 18 18  ' Height:      Weight:      SpO2: 100% 100% 100% 100%    Last BM Date: 02/15/15  Intake/Output   Yesterday:  02/27 0701 - 02/28 0700 In: 3134.2 [P.O.:1440; I.V.:1694.2] Out: 1990 [Urine:1250; Drains:740] This shift:     Bowel function:  Flatus: n  BM: n  Drain: serous - no bile/blood/stool  Physical Exam:  General: Pt awake/alert/oriented x4 in no acute distress Eyes: PERRL, normal EOM.  Sclera clear.  No icterus Neuro: CN II-XII intact w/o focal sensory/motor deficits. Lymph: No head/neck/groin lymphadenopathy Psych:  No delerium/psychosis/paranoia HENT: Normocephalic, Mucus membranes moist.  No thrush Neck: Supple, No tracheal deviation Chest: No chest wall pain w good excursion CV:  Pulses intact.  Regular rhythm MS: Normal AROM mjr joints.  No obvious deformity Abdomen: Mod distended but soft.  Min tender at incisions only.  No evidence of peritonitis.  No incarcerated hernias. Ext:  SCDs BLE.  No mjr edema.  No cyanosis Skin: No petechiae / purpura  Results:   Labs: Results for orders placed or performed during the hospital encounter of 02/15/15 (from the past 48 hour(s))  CBC     Status: Abnormal   Collection Time: 02/18/15  6:06 AM  Result Value Ref Range   WBC 8.2 4.0 - 10.5 K/uL   RBC 3.46 (L) 3.87 - 5.11 MIL/uL   Hemoglobin 9.5 (L) 12.0 - 15.0 g/dL   HCT 29.5 (L) 36.0 - 46.0 %   MCV 85.3 78.0 -  100.0 fL   MCH 27.5 26.0 - 34.0 pg   MCHC 32.2 30.0 - 36.0 g/dL   RDW 17.6 (H) 11.5 - 15.5 %   Platelets 260 150 - 400 K/uL  Basic metabolic panel     Status: Abnormal   Collection Time: 02/18/15  6:06 AM  Result Value Ref Range   Sodium 136 135 - 145 mmol/L   Potassium 4.0 3.5 - 5.1 mmol/L   Chloride 106 96 - 112 mmol/L   CO2 22 19 - 32 mmol/L   Glucose, Bld 109 (H) 70 - 99 mg/dL   BUN 6 6 - 23 mg/dL   Creatinine, Ser 0.68 0.50 - 1.10 mg/dL   Calcium  8.5 8.4 - 10.5 mg/dL   GFR calc non Af Amer >90 >90 mL/min   GFR calc Af Amer >90 >90 mL/min    Comment: (NOTE) The eGFR has been calculated using the CKD EPI equation. This calculation has not been validated in all clinical situations. eGFR's persistently <90 mL/min signify possible Chronic Kidney Disease.    Anion gap 8 5 - 15    Imaging / Studies: No results found.  Medications / Allergies: per chart  Antibiotics: Anti-infectives    Start     Dose/Rate Route Frequency Ordered Stop   02/16/15 0400  piperacillin-tazobactam (ZOSYN) IVPB 3.375 g     3.375 g 12.5 mL/hr over 240 Minutes Intravenous Every 8 hours 02/16/15 0216     02/15/15 2030  piperacillin-tazobactam (ZOSYN) IVPB 3.375 g     3.375 g 12.5 mL/hr over 240 Minutes Intravenous  Once 02/15/15 2030 02/15/15 2113       Note: Portions of this report may have been transcribed using voice recognition software. Every effort was made to ensure accuracy; however, inadvertent computerized transcription errors may be present.   Any transcriptional errors that result from this process are unintentional.     Vanessa Cochran, M.D., F.A.C.S. Gastrointestinal and Minimally Invasive Surgery [facility] [address], [place], [place] [address] [phone] Main / Paging   02/18/2015

## 2015-02-19 MED ORDER — BISACODYL 10 MG RE SUPP
10.0000 mg | Freq: Every day | RECTAL | Status: DC
  Administered 2015-02-19 – 2015-02-20 (×2): 10 mg via RECTAL
  Filled 2015-02-19 (×3): qty 1

## 2015-02-19 NOTE — Progress Notes (Signed)
[facility] Progress Note  4 Days Post-Op  Subjective: Pt okay, tolerating sips.  Feels bloated and sore at incision sites.  Says she's not much better.  Has been walking numerous laps around the unit - 6 at a time.  Having flatus, no BM yet.  Mother and father at bedside.  Mother says patient had a boil on her right buttock, but is not there today.  Objective: Vital signs in last 24 hours: Temp:  [98.1 F (36.7 C)-98.8 F (37.1 C)] 98.4 F (36.9 C) (02/29 0629) Pulse Rate:  [69-74] 74 (02/29 0629) Resp:  [17-18] 17 (02/29 0629) BP: (110-117)/(51-75) 117/67 mmHg (02/29 0629) SpO2:  [100 %] 100 % (02/29 0629) Last BM Date: 02/15/15  Intake/Output from previous day: 02/28 0701 - 02/29 0700 In: 2200 [I.V.:1800; IV Piggyback:400] Out: 1225 [Urine:1000; Drains:225] Intake/Output this shift:    PE: Gen:  Alert, NAD, very flat affect Abd: Soft, minimal tenderness, mildly distended, +BS, no HSM, incisions C/D/I, drain with serous drainage (22425mL/24hr)   Lab Results:   Recent Labs  02/18/15 0606  WBC 8.2  HGB 9.5*  HCT 29.5*  PLT 260   BMET  Recent Labs  02/18/15 0606  NA 136  K 4.0  CL 106  CO2 22  GLUCOSE 109*  BUN 6  CREATININE 0.68  CALCIUM 8.5   PT/INR No results for input(s): LABPROT, INR in the last 72 hours. CMP     Component Value Date/Time   NA 136 02/18/2015 0606   K 4.0 02/18/2015 0606   CL 106 02/18/2015 0606   CO2 22 02/18/2015 0606   GLUCOSE 109* 02/18/2015 0606   BUN 6 02/18/2015 0606   CREATININE 0.68 02/18/2015 0606   CALCIUM 8.5 02/18/2015 0606   GFRNONAA >90 02/18/2015 0606   GFRAA >90 02/18/2015 0606   Lipase  No results found for: LIPASE     Studies/Results: No results found.  Anti-infectives: Anti-infectives    Start     Dose/Rate Route Frequency Ordered Stop   02/16/15 0400  piperacillin-tazobactam (ZOSYN) IVPB 3.375 g     3.375 g 12.5 mL/hr over 240 Minutes Intravenous Every 8 hours 02/16/15 0216     02/15/15 2030  piperacillin-tazobactam (ZOSYN) IVPB 3.375 g     3.375 g 12.5 mL/hr over 240 Minutes Intravenous  Once 02/15/15 2030 02/15/15 2113       Assessment/Plan Perforated appendicitis POD #4 s/p lap appy with drainage of abscess - 02/15/2015 - D. Krimson Massmann Post-operative ileus -start clears, IVF, pain control, antiemetics, IV antibiotics (Zosyn Day #4) -d/c IV meds, encourage ultram -Ambulate and IS -JP drain SEROUS output and 25725mL/24hr which is improved from 740mL yesterday, may be able to come out prior to discharge if output improves -dulcolax  DVT proph -SCD's and heparin SQ    LOS: 3 days    DORT, MEGAN 02/19/2015, 7:59 AM Pager: [phone]  Agree with above. Had BM.  Feeling a little better. I hear very few bowel sounds. Agree with starting clear liquids to see how she does.  Ovidio Kinavid Tammie Ellsworth, MD, [facility] Pager: [phone] Office phone:  [phone]

## 2015-02-19 NOTE — Care Management Note (Signed)
    Page 1 of 1   02/19/2015     10:39:30 AM CARE MANAGEMENT NOTE 02/19/2015  Patient:  Vanessa Cochran,Vanessa Cochran   Account Number:  [id number]  Date Initiated:  02/19/2015  Documentation initiated by:  Lorenda IshiharaPEELE,Mazzie Brodrick  Subjective/Objective Assessment:   19 yo female admitted s/p lap appy with drainage of abscess. PTA lived at home with mother.     Action/Plan:   Home when stable   Anticipated DC Date:  02/21/2015   Anticipated DC Plan:  HOME/SELF CARE      DC Planning Services  CM consult      Choice offered to / List presented to:             Status of service:  Completed, signed off Medicare Important Message given?   (If response is "NO", the following Medicare IM given date fields will be blank) Date Medicare IM given:   Medicare IM given by:   Date Additional Medicare IM given:   Additional Medicare IM given by:    Discharge Disposition:  HOME/SELF CARE  Per UR Regulation:  Reviewed for med. necessity/level of care/duration of stay  If discussed at Long Length of Stay Meetings, dates discussed:    Comments:

## 2015-02-20 MED ORDER — TRAMADOL HCL 50 MG PO TABS: 50.0000 mg | ORAL_TABLET | Freq: Four times a day (QID) | ORAL | Status: DC | PRN

## 2015-02-20 MED ORDER — AMOXICILLIN-POT CLAVULANATE 875-125 MG PO TABS: 1.0000 | ORAL_TABLET | Freq: Two times a day (BID) | ORAL | Status: DC

## 2015-02-20 NOTE — Discharge Summary (Signed)
  [facility] Discharge Summary   Patient ID: Vanessa JumpKeisha Cochran MRN: [id number] DOB/AGE: 24-Oct-1996 19 y.o.  Admit date: 02/15/2015 Discharge date: 02/20/2015  Admitting Diagnosis: Acute perforated appendicitis  Discharge Diagnosis Patient Active Problem List   Diagnosis Date Noted  . Appendicitis 02/16/2015  . Perforated appendicitis 02/16/2015    Consultants None  Imaging: No results found.  Procedures Dr. Ezzard StandingNewman (02/16/15) - Laparoscopic appendectomy. Drainage of abscess. Retrieval of appendolith.   Hospital Course:  19 y/o AAfemale [facility].  [facility].  The patient started having pain Sunday, 02/11/2015. She had nausea early with her symptoms. She went to the [facility] ER on Monday, 2/22. They thought her pain was gynecologic. They referred her to Dr. Earleen NewportAriel Arus, GYN, [facility]. It sounds like he did an US, but was unsure of her diagnosis and gave her pain meds. She has no GI history. She has had no prior abdominal surgery. Her last period was last Thursday, 2/18. She has never been pregnant.  CT of abdomen - 02/15/2015 - Inflammatory mass in the RIGHT lower quadrant with a calcification posteriorly, most compatible with ruptured appendicitis and abscess. The inflammatory mass measures about 6 cm and is immediately adjacent to the cecum  Workup showed ruptured appendicitis with phlegmon in RLQ/Right pelvis.  Patient was admitted and underwent procedure listed above.  Tolerated procedure well and was transferred to the floor.  She experienced a post-operative ileus, but this soon resolved.  Diet was advanced as tolerated.  On POD #5, the patient was voiding well, tolerating diet, ambulating well, pain well controlled, vital signs stable, incisions c/d/i and felt stable for discharge home.  JP drain with only serous output will be discontinued prior  to discharge.  Patient will follow up in our office in 2-3 weeks and knows to call with questions or concerns.      Medication List    STOP taking these medications        oxyCODONE-acetaminophen 5-325 MG per tablet  Commonly known as:  PERCOCET/ROXICET      TAKE these medications        amoxicillin-clavulanate 875-125 MG per tablet  Commonly known as:  AUGMENTIN  Take 1 tablet by mouth 2 (two) times daily.     traMADol 50 MG tablet  Commonly known as:  ULTRAM  Take 1-2 tablets (50-100 mg total) by mouth every 6 (six) hours as needed for moderate pain or severe pain.         Follow-up Information    Follow up with [facility] [place] On 03/06/2015.   Why:  For post-operation check at 1:30pm, please arrive by 1:00pm to check in and fill out paperwork.     Contact information:   [address] [place] [address] [phone]      Signed: Candiss NorseMegan Dort, PA-C [facility] [phone]  02/20/2015, 2:02 PM

## 2015-02-20 NOTE — Progress Notes (Signed)
Nurse reviewed discharge instructions with pt.  Pt verbalized understanding of discharge instructions, follow up appointment and new medication.   No concerns at time of discharge.  JP drain removed without complications and teaching given for site care.

## 2015-02-20 NOTE — Progress Notes (Signed)
[facility] Progress Note  5 Days Post-Op  Subjective: Pt doing well, no N/V, tolerating clears.  Having BM's and flatus.  Abdomen no longer distended.  Ambulating through the halls very well.  Drain is SEROUS and output decreasing.  Objective: Vital signs in last 24 hours: Temp:  [98.2 F (36.8 C)-99 F (37.2 C)] 99 F (37.2 C) (03/01 0534) Pulse Rate:  [66-79] 79 (03/01 0534) Resp:  [16-18] 18 (03/01 0534) BP: (113-133)/(59-71) 131/59 mmHg (03/01 0534) SpO2:  [100 %] 100 % (03/01 0534) Last BM Date: 02/19/15  Intake/Output from previous day: 02/29 0701 - 03/01 0700 In: 2277.5 [P.O.:360; I.V.:1767.5; IV Piggyback:150] Out: 2185 [Urine:2050; Drains:135] Intake/Output this shift:    PE: Gen:  Alert, NAD, pleasant Abd: Soft, minimal tenderness, ND, +BS, no HSM, incisions C/D/I, drain with SEROUS drainage   Lab Results:   Recent Labs  02/18/15 0606  WBC 8.2  HGB 9.5*  HCT 29.5*  PLT 260   BMET  Recent Labs  02/18/15 0606  NA 136  K 4.0  CL 106  CO2 22  GLUCOSE 109*  BUN 6  CREATININE 0.68  CALCIUM 8.5   PT/INR No results for input(s): LABPROT, INR in the last 72 hours. CMP     Component Value Date/Time   NA 136 02/18/2015 0606   K 4.0 02/18/2015 0606   CL 106 02/18/2015 0606   CO2 22 02/18/2015 0606   GLUCOSE 109* 02/18/2015 0606   BUN 6 02/18/2015 0606   CREATININE 0.68 02/18/2015 0606   CALCIUM 8.5 02/18/2015 0606   GFRNONAA >90 02/18/2015 0606   GFRAA >90 02/18/2015 0606   Lipase  No results found for: LIPASE     Studies/Results: No results found.  Anti-infectives: Anti-infectives    Start     Dose/Rate Route Frequency Ordered Stop   02/16/15 0400  piperacillin-tazobactam (ZOSYN) IVPB 3.375 g     3.375 g 12.5 mL/hr over 240 Minutes Intravenous Every 8 hours 02/16/15 0216     02/15/15 2030  piperacillin-tazobactam (ZOSYN) IVPB 3.375 g     3.375 g 12.5 mL/hr over 240 Minutes Intravenous  Once 02/15/15 2030 02/15/15  2113       Assessment/Plan Perforated appendicitis POD #5 s/p lap appy with drainage of abscess - 02/15/2015 - D. Newman Post-operative ileus -start clears, IVF, pain control, antiemetics, IV antibiotics (Zosyn Day #5) -d/c IV meds, encourage ultram -Ambulate and IS -JP drain SEROUS output and 14635mL/24hr, may be able to come out prior to discharge if output improves -Dulcolax prn -Had a BM  DVT proph -SCD's and heparin SQ  Disp -D/c today or tomorrow on oral ABX    LOS: 4 days    Cochran, Vanessa Waldren 02/20/2015, 7:20 AM Pager: [phone]

## 2016-03-08 ENCOUNTER — Emergency Department (HOSPITAL_BASED_OUTPATIENT_CLINIC_OR_DEPARTMENT_OTHER)
Admission: EM | Admit: 2016-03-08 | Discharge: 2016-03-09 | Disposition: A | Discharge status: Home / Self Care | Payer: Medicaid Other | Attending: Emergency Medicine | Admitting: Emergency Medicine

## 2016-03-08 ENCOUNTER — Encounter (HOSPITAL_BASED_OUTPATIENT_CLINIC_OR_DEPARTMENT_OTHER): Payer: Self-pay | Admitting: Emergency Medicine

## 2016-03-08 DIAGNOSIS — J45909 Unspecified asthma, uncomplicated: Secondary | ICD-10-CM | POA: Insufficient documentation

## 2016-03-08 DIAGNOSIS — J3489 Other specified disorders of nose and nasal sinuses: Secondary | ICD-10-CM | POA: Insufficient documentation

## 2016-03-08 DIAGNOSIS — R51 Headache: Secondary | ICD-10-CM | POA: Insufficient documentation

## 2016-03-08 DIAGNOSIS — Z792 Long term (current) use of antibiotics: Secondary | ICD-10-CM | POA: Insufficient documentation

## 2016-03-08 DIAGNOSIS — J029 Acute pharyngitis, unspecified: Secondary | ICD-10-CM | POA: Insufficient documentation

## 2016-03-08 DIAGNOSIS — R509 Fever, unspecified: Secondary | ICD-10-CM | POA: Insufficient documentation

## 2016-03-08 LAB — RAPID STREP SCREEN (MED CTR MEBANE ONLY): Streptococcus, Group A Screen (Direct): NEGATIVE

## 2016-03-08 MED ORDER — DEXAMETHASONE 6 MG PO TABS
10.0000 mg | ORAL_TABLET | Freq: Once | ORAL | Status: AC
  Administered 2016-03-08: 10 mg via ORAL
  Filled 2016-03-08: qty 1

## 2016-03-08 MED ORDER — KETOROLAC TROMETHAMINE 60 MG/2ML IM SOLN
60.0000 mg | Freq: Once | INTRAMUSCULAR | Status: AC
  Administered 2016-03-08: 60 mg via INTRAMUSCULAR
  Filled 2016-03-08: qty 2

## 2016-03-08 MED ORDER — ACETAMINOPHEN 325 MG PO TABS
975.0000 mg | ORAL_TABLET | Freq: Once | ORAL | Status: AC
  Administered 2016-03-08: 975 mg via ORAL
  Filled 2016-03-08: qty 3

## 2016-03-08 MED ORDER — CETIRIZINE-PSEUDOEPHEDRINE ER 5-120 MG PO TB12: 1.0000 | ORAL_TABLET | Freq: Every day | ORAL | Status: DC

## 2016-03-08 NOTE — ED Notes (Signed)
Pt in c/o fever, body aches, and chills onset this am, did not take tylenol or ibuprofen. Afebrile in triage.

## 2016-03-08 NOTE — ED Provider Notes (Signed)
CSN: [id number]     Arrival date & time 03/08/16  2049 History  By signing my name below, I, [facility], attest that this documentation has been prepared under the direction and in the presence of Marily Memos, MD. Electronically Signed: Randell Patient, ED Scribe. 03/08/2016. 12:08 AM.    Chief Complaint  Patient presents with  . Fever   The history is provided by the patient. No language interpreter was used.   HPI Comments: Vanessa Cochran is a 20 y.o. female who presents to the Emergency Department complaining of constant, mild, unchanging fever onset this 21 hours ago. Patient reports HA, sore throat, CP for which she has taken Nyquil without relief. She endorses associated body aches, sinus pressure, chills, and rhinorrhea. She has been drinking less. 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 Denies rashes, post nasal drip.  Past Medical History  Diagnosis Date  . Asthma    Past Surgical History  Procedure Laterality Date  . Laparoscopic appendectomy N/A 02/15/2015    Procedure: APPENDECTOMY LAPAROSCOPIC,drainage of abcess;  Surgeon: Ovidio Kin, MD;  Location: [facility];  Service: General;  Laterality: N/A;   History reviewed. No pertinent family history. Social History  Substance Use Topics  . Smoking status: Never Smoker   . Smokeless tobacco: None  . Alcohol Use: No   OB History    No data available     Review of Systems  Constitutional: Positive for fever and chills.  HENT: Positive for rhinorrhea, sinus pressure and sore throat. Negative for postnasal drip.   Cardiovascular: Positive for chest pain.  Skin: Negative for rash.  Neurological: Positive for headaches.      Allergies  Review of patient's allergies indicates no known allergies.  Home Medications   Prior to Admission medications   Medication Sig Start Date End Date Taking? Authorizing Provider  amoxicillin-clavulanate (AUGMENTIN) 875-125 MG per tablet Take 1 tablet by mouth 2 (two) times  daily. 02/20/15   Nonie Hoyer, PA-C  cetirizine-pseudoephedrine (ZYRTEC-D) 5-120 MG tablet Take 1 tablet by mouth daily. 03/08/16   Marily Memos, MD  traMADol (ULTRAM) 50 MG tablet Take 1-2 tablets (50-100 mg total) by mouth every 6 (six) hours as needed for moderate pain or severe pain. 02/20/15   Megan N Baird, PA-C   BP 131/91 mmHg  Pulse 87  Temp(Src) 100.1 F (37.8 C) (Oral)  Resp 18  Ht  (1.778 m)  Wt 170 lb (77.111 kg)  BMI 24.39 kg/m2  SpO2 100% Physical Exam  Constitutional: She is oriented to person, place, and time. She appears well-developed and well-nourished. No distress.  HENT:  Head: Normocephalic and atraumatic.  Mild tonsillary erythema but no exudates.  Eyes: Conjunctivae and EOM are normal.  Neck: Neck supple. No tracheal deviation present.  Right cervical chain lymphadenopathy.  Cardiovascular: Normal rate.   Pulmonary/Chest: Effort normal. No respiratory distress.  Musculoskeletal: Normal range of motion.  Lymphadenopathy:    She has cervical adenopathy.  Neurological: She is alert and oriented to person, place, and time.  Skin: Skin is warm and dry.  Psychiatric: She has a normal mood and affect. Her behavior is normal.  Nursing note and vitals reviewed.   ED Course  Procedures   DIAGNOSTIC STUDIES: Oxygen Saturation is 100% on RA, normal by my interpretation.    COORDINATION OF CARE: 10:20 PM Advised pt to take ibuprofen and Tylenol as needed, increase fluid intake, and rest. Advised pt to follow-up with PCP as needed if symptoms do not improve  and return to ED if symptoms worsen. Discussed treatment plan with pt at bedside and pt agreed to plan.   Labs Review Labs Reviewed  RAPID STREP SCREEN (NOT AT [facility])  CULTURE, GROUP A STREP [facility])    Imaging Review No results found. I have personally reviewed and evaluated these lab results as part of my medical decision-making.   MDM   Final diagnoses:  Fever, unspecified fever cause     Here with fever. Possibly flu, no indication for treatment. Doubt PNA. Lungs clear, abdomen otherwise as above. Suggested supportive treatment and rest.   New Prescriptions: New Prescriptions   CETIRIZINE-PSEUDOEPHEDRINE (ZYRTEC-D) 5-120 MG TABLET    Take 1 tablet by mouth daily.    I have personally and contemperaneously reviewed labs and imaging and used in my decision making as above.   A medical screening exam was performed and I feel the patient has had an appropriate workup for their chief complaint at this time and likelihood of emergent condition existing is low. Their vital signs are stable. They have been counseled on decision, discharge, follow up and which symptoms necessitate immediate return to the emergency department.  They verbally stated understanding and agreement with plan and discharged in stable condition.   I personally performed the services described in this documentation, which was scribed in my presence. The recorded information has been reviewed and is accurate.    Marily MemosJason Gorje Iyer, MD 03/09/16 [phone]

## 2016-03-10 LAB — CULTURE, GROUP A STREP (THRC)

## 2016-03-16 ENCOUNTER — Encounter (HOSPITAL_BASED_OUTPATIENT_CLINIC_OR_DEPARTMENT_OTHER): Payer: Self-pay | Admitting: Emergency Medicine

## 2016-03-16 ENCOUNTER — Emergency Department (HOSPITAL_BASED_OUTPATIENT_CLINIC_OR_DEPARTMENT_OTHER)
Admission: EM | Admit: 2016-03-16 | Discharge: 2016-03-17 | Disposition: A | Discharge status: Home / Self Care | Payer: Self-pay | Attending: Emergency Medicine | Admitting: Emergency Medicine

## 2016-03-16 DIAGNOSIS — Z792 Long term (current) use of antibiotics: Secondary | ICD-10-CM | POA: Insufficient documentation

## 2016-03-16 DIAGNOSIS — Z79899 Other long term (current) drug therapy: Secondary | ICD-10-CM | POA: Insufficient documentation

## 2016-03-16 DIAGNOSIS — J45909 Unspecified asthma, uncomplicated: Secondary | ICD-10-CM | POA: Insufficient documentation

## 2016-03-16 DIAGNOSIS — J069 Acute upper respiratory infection, unspecified: Secondary | ICD-10-CM | POA: Insufficient documentation

## 2016-03-16 NOTE — ED Notes (Signed)
Patient states that she continues to have cough, sore throat and body aches. The patient reports that now she had spots on her throat.

## 2016-03-16 NOTE — ED Provider Notes (Signed)
CSN: [id number]     Arrival date & time 03/16/16  2135 History   First MD Initiated Contact with Patient 03/16/16 2354     Chief Complaint  Patient presents with  . Generalized Body Aches   HPI   20 year old female presents today with complaints of generalized body aches, cough, fever, chills. Patient reports that symptoms started on 03/09/2015 approximately 10 days ago. She reports the fever has resolved, but continues to experience body aches, cough, rhinorrhea and congestion. Patient reports she's been able tolerate by mouth without difficulty, has been able to go to work but feels that this is hindering her recovery. Patient reports she was discharged home with Zyrtec, this is not helped with the upper respiratory complaints. She denies any change to the cough, describes as dry and nonproductive. She denies any chest pain or shortness of breath, abdominal pain,   Past Medical History  Diagnosis Date  . Asthma    Past Surgical History  Procedure Laterality Date  . Laparoscopic appendectomy N/A 02/15/2015    Procedure: APPENDECTOMY LAPAROSCOPIC,drainage of abcess;  Surgeon: Ovidio Kinavid Newman, MD;  Location: [facility];  Service: General;  Laterality: N/A;   History reviewed. No pertinent family history. Social History  Substance Use Topics  . Smoking status: Never Smoker   . Smokeless tobacco: None  . Alcohol Use: No   OB History    No data available     Review of Systems  All other systems reviewed and are negative.   Allergies  Review of patient's allergies indicates no known allergies.  Home Medications   Prior to Admission medications   Medication Sig Start Date End Date Taking? Authorizing Provider  amoxicillin-clavulanate (AUGMENTIN) 875-125 MG per tablet Take 1 tablet by mouth 2 (two) times daily. 02/20/15   Nonie HoyerMegan N Baird, PA-C  benzonatate (TESSALON) 100 MG capsule Take 1 capsule (100 mg total) by mouth every 8 (eight) hours. 03/17/16   Eyvonne MechanicJeffrey Brenlynn Fake, PA-C   cetirizine-pseudoephedrine (ZYRTEC-D) 5-120 MG tablet Take 1 tablet by mouth daily. 03/08/16   Marily MemosJason Mesner, MD  fluticasone (FLONASE) 50 MCG/ACT nasal spray Place 2 sprays into both nostrils daily. 03/17/16   Eyvonne MechanicJeffrey Annalyssa Thune, PA-C  loratadine (CLARITIN) 10 MG tablet Take 1 tablet (10 mg total) by mouth daily. 03/17/16   Tinnie GensJeffrey Marily Konczal, PA-C  ondansetron (ZOFRAN) 4 MG tablet Take 1 tablet (4 mg total) by mouth every 6 (six) hours. 03/17/16   Eyvonne MechanicJeffrey Paula Busenbark, PA-C  traMADol (ULTRAM) 50 MG tablet Take 1-2 tablets (50-100 mg total) by mouth every 6 (six) hours as needed for moderate pain or severe pain. 02/20/15   Megan N Baird, PA-C   BP 115/69 mmHg  Pulse 73  Temp(Src) 98 F (36.7 C) (Oral)  Resp 18  Ht 5\' 10"  (1.778 m)  Wt 77.111 kg  BMI 24.39 kg/m2  SpO2 99%  LMP 03/08/2016 Physical Exam  Constitutional: She is oriented to person, place, and time. She appears well-developed and well-nourished.  HENT:  Head: Normocephalic and atraumatic.  Right Ear: Hearing and tympanic membrane normal.  Left Ear: Hearing and tympanic membrane normal.  Nose: Rhinorrhea present.  Mouth/Throat: Uvula is midline and oropharynx is clear and moist. No oropharyngeal exudate, posterior oropharyngeal edema or posterior oropharyngeal erythema.  Eyes: Conjunctivae are normal. Pupils are equal, round, and reactive to light. Right eye exhibits no discharge. Left eye exhibits no discharge. No scleral icterus.  Neck: Normal range of motion. No JVD present. No tracheal deviation present.  Cardiovascular: Normal rate, regular rhythm, normal  heart sounds and intact distal pulses.  Exam reveals no gallop and no friction rub.   No murmur heard. Pulmonary/Chest: Effort normal and breath sounds normal. No stridor. No respiratory distress. She has no wheezes. She has no rales. She exhibits no tenderness.  Musculoskeletal: Normal range of motion. She exhibits no edema or tenderness.  Neurological: She is alert and oriented to  person, place, and time. Coordination normal.  Psychiatric: She has a normal mood and affect. Her behavior is normal. Judgment and thought content normal.  Nursing note and vitals reviewed.   ED Course  Procedures (including critical care time) Labs Review Labs Reviewed - No data to display  Imaging Review No results found. I have personally reviewed and evaluated these images and lab results as part of my medical decision-making.   EKG Interpretation None      MDM   Final diagnoses:  URI (upper respiratory infection)    Labs:   Imaging:  Consults:  Therapeutics:  Discharge Meds: Flonase, Claritin, Zofran, Tessalon  Assessment/Plan:  20 year old female presents today with likely viral upper respiratory infection. Patient has had improvement in her symptoms, she is afebrile, nontoxic in no acute distress. Patient's lungs are clear, with no focal signs of bacterial infection. Patient will be given cough medication, antihistamines, work note for 2 days, encouraged follow-up with her primary care provider for reevaluation and further management. She is given strict return percussion, verbalize understanding and agreement today's plan.         Eyvonne Mechanic, PA-C 03/17/16 1610  Doug Sou, MD 03/18/16 1311

## 2016-03-17 MED ORDER — LORATADINE 10 MG PO TABS: 10.0000 mg | ORAL_TABLET | Freq: Every day | ORAL | Status: DC

## 2016-03-17 MED ORDER — ONDANSETRON HCL 4 MG PO TABS: 4.0000 mg | ORAL_TABLET | Freq: Four times a day (QID) | ORAL | Status: DC

## 2016-03-17 MED ORDER — BENZONATATE 100 MG PO CAPS: 100.0000 mg | ORAL_CAPSULE | Freq: Three times a day (TID) | ORAL | Status: DC

## 2016-03-17 MED ORDER — FLUTICASONE PROPIONATE 50 MCG/ACT NA SUSP: 2.0000 | Freq: Every day | NASAL | Status: DC

## 2016-03-17 NOTE — Discharge Instructions (Signed)
Upper Respiratory Infection, Adult Most upper respiratory infections (URIs) are a viral infection of the air passages leading to the lungs. A URI affects the nose, throat, and upper air passages. The most common type of URI is nasopharyngitis and is typically referred to as "the common cold." URIs run their course and usually go away on their own. Most of the time, a URI does not require medical attention, but sometimes a bacterial infection in the upper airways can follow a viral infection. This is called a secondary infection. Sinus and middle ear infections are common types of secondary upper respiratory infections. Bacterial pneumonia can also complicate a URI. A URI can worsen asthma and chronic obstructive pulmonary disease (COPD). Sometimes, these complications can require emergency medical care and may be life threatening.  CAUSES Almost all URIs are caused by viruses. A virus is a type of germ and can spread from one person to another.  RISKS FACTORS You may be at risk for a URI if:   You smoke.   You have chronic heart or lung disease.  You have a weakened defense (immune) system.   You are very young or very old.   You have nasal allergies or asthma.  You work in crowded or poorly ventilated areas.  You work in health care facilities or schools. SIGNS AND SYMPTOMS  Symptoms typically develop 2-3 days after you come in contact with a cold virus. Most viral URIs last 7-10 days. However, viral URIs from the influenza virus (flu virus) can last 14-18 days and are typically more severe. Symptoms may include:   Runny or stuffy (congested) nose.   Sneezing.   Cough.   Sore throat.   Headache.   Fatigue.   Fever.   Loss of appetite.   Pain in your forehead, behind your eyes, and over your cheekbones (sinus pain).  Muscle aches.  DIAGNOSIS  Your health care provider may diagnose a URI by:  Physical exam.  Tests to check that your symptoms are not due to  another condition such as:  Strep throat.  Sinusitis.  Pneumonia.  Asthma. TREATMENT  A URI goes away on its own with time. It cannot be cured with medicines, but medicines may be prescribed or recommended to relieve symptoms. Medicines may help:  Reduce your fever.  Reduce your cough.  Relieve nasal congestion. HOME CARE INSTRUCTIONS   Take medicines only as directed by your health care provider.   Gargle warm saltwater or take cough drops to comfort your throat as directed by your health care provider.  Use a warm mist humidifier or inhale steam from a shower to increase air moisture. This may make it easier to breathe.  Drink enough fluid to keep your urine clear or pale yellow.   Eat soups and other clear broths and maintain good nutrition.   Rest as needed.   Return to work when your temperature has returned to normal or as your health care provider advises. You may need to stay home longer to avoid infecting others. You can also use a face mask and careful hand washing to prevent spread of the virus.  Increase the usage of your inhaler if you have asthma.   Do not use any tobacco products, including cigarettes, chewing tobacco, or electronic cigarettes. If you need help quitting, ask your health care provider. PREVENTION  The best way to protect yourself from getting a cold is to practice good hygiene.   Avoid oral or hand contact with people with cold   symptoms.   Wash your hands often if contact occurs.  There is no clear evidence that vitamin C, vitamin E, echinacea, or exercise reduces the chance of developing a cold. However, it is always recommended to get plenty of rest, exercise, and practice good nutrition.  SEEK MEDICAL CARE IF:   You are getting worse rather than better.   Your symptoms are not controlled by medicine.   You have chills.  You have worsening shortness of breath.  You have brown or red mucus.  You have yellow or brown nasal  discharge.  You have pain in your face, especially when you bend forward.  You have a fever.  You have swollen neck glands.  You have pain while swallowing.  You have white areas in the back of your throat. SEEK IMMEDIATE MEDICAL CARE IF:   You have severe or persistent:  Headache.  Ear pain.  Sinus pain.  Chest pain.  You have chronic lung disease and any of the following:  Wheezing.  Prolonged cough.  Coughing up blood.  A change in your usual mucus.  You have a stiff neck.  You have changes in your:  Vision.  Hearing.  Thinking.  Mood. MAKE SURE YOU:   Understand these instructions.  Will watch your condition.  Will get help right away if you are not doing well or get worse.   This information is not intended to replace advice given to you by your health care provider. Make sure you discuss any questions you have with your health care provider.   Document Released: 06/03/2001 Document Revised: 04/24/2015 Document Reviewed: 03/15/2014 Elsevier Interactive Patient Education 2016 [employer]  

## 2017-02-05 ENCOUNTER — Encounter (HOSPITAL_BASED_OUTPATIENT_CLINIC_OR_DEPARTMENT_OTHER): Payer: Self-pay

## 2017-02-05 ENCOUNTER — Emergency Department (HOSPITAL_BASED_OUTPATIENT_CLINIC_OR_DEPARTMENT_OTHER)
Admission: EM | Admit: 2017-02-05 | Discharge: 2017-02-05 | Disposition: A | Discharge status: Home / Self Care | Payer: Self-pay | Attending: Emergency Medicine | Admitting: Emergency Medicine

## 2017-02-05 DIAGNOSIS — N730 Acute parametritis and pelvic cellulitis: Secondary | ICD-10-CM

## 2017-02-05 DIAGNOSIS — J45909 Unspecified asthma, uncomplicated: Secondary | ICD-10-CM | POA: Insufficient documentation

## 2017-02-05 DIAGNOSIS — K529 Noninfective gastroenteritis and colitis, unspecified: Secondary | ICD-10-CM

## 2017-02-05 DIAGNOSIS — N760 Acute vaginitis: Secondary | ICD-10-CM | POA: Insufficient documentation

## 2017-02-05 DIAGNOSIS — B9689 Other specified bacterial agents as the cause of diseases classified elsewhere: Secondary | ICD-10-CM

## 2017-02-05 DIAGNOSIS — F17290 Nicotine dependence, other tobacco product, uncomplicated: Secondary | ICD-10-CM | POA: Insufficient documentation

## 2017-02-05 LAB — CBC
HCT: 35.2 % — ABNORMAL LOW (ref 36.0–46.0)
Hemoglobin: 11.6 g/dL — ABNORMAL LOW (ref 12.0–15.0)
MCH: 29 pg (ref 26.0–34.0)
MCHC: 33 g/dL (ref 30.0–36.0)
MCV: 88 fL (ref 78.0–100.0)
Platelets: 234 10*3/uL (ref 150–400)
RBC: 4 MIL/uL (ref 3.87–5.11)
RDW: 15 % (ref 11.5–15.5)
WBC: 4.4 10*3/uL (ref 4.0–10.5)

## 2017-02-05 LAB — URINALYSIS, ROUTINE W REFLEX MICROSCOPIC
Glucose, UA: NEGATIVE mg/dL
Ketones, ur: 15 mg/dL — AB
Leukocytes, UA: NEGATIVE
NITRITE: NEGATIVE
PROTEIN: 100 mg/dL — AB
Specific Gravity, Urine: 1.034 — ABNORMAL HIGH (ref 1.005–1.030)
pH: 6 (ref 5.0–8.0)

## 2017-02-05 LAB — COMPREHENSIVE METABOLIC PANEL
ALBUMIN: 4.2 g/dL (ref 3.5–5.0)
ALT: 10 U/L — AB (ref 14–54)
AST: 18 U/L (ref 15–41)
Alkaline Phosphatase: 49 U/L (ref 38–126)
Anion gap: 6 (ref 5–15)
BUN: 11 mg/dL (ref 6–20)
CO2: 23 mmol/L (ref 22–32)
CREATININE: 0.84 mg/dL (ref 0.44–1.00)
Calcium: 9.3 mg/dL (ref 8.9–10.3)
Chloride: 106 mmol/L (ref 101–111)
GFR calc Af Amer: >60 mL/min (ref >60)
GFR calc non Af Amer: >60 mL/min (ref >60)
GLUCOSE: 102 mg/dL — AB (ref 65–99)
Potassium: 4.2 mmol/L (ref 3.5–5.1)
Sodium: 135 mmol/L (ref 135–145)
Total Bilirubin: 0.8 mg/dL (ref 0.3–1.2)
Total Protein: 8.1 g/dL (ref 6.5–8.1)

## 2017-02-05 LAB — WET PREP, GENITAL
Sperm: NONE SEEN
TRICH WET PREP: NONE SEEN
Yeast Wet Prep HPF POC: NONE SEEN

## 2017-02-05 LAB — PREGNANCY, URINE: PREG TEST UR: NEGATIVE

## 2017-02-05 LAB — URINALYSIS, MICROSCOPIC (REFLEX)

## 2017-02-05 MED ORDER — CEFTRIAXONE SODIUM 250 MG IJ SOLR
250.0000 mg | Freq: Once | INTRAMUSCULAR | Status: AC
  Administered 2017-02-05: 250 mg via INTRAMUSCULAR
  Filled 2017-02-05: qty 250

## 2017-02-05 MED ORDER — ONDANSETRON HCL 4 MG/2ML IJ SOLN
4.0000 mg | Freq: Once | INTRAMUSCULAR | Status: AC
  Administered 2017-02-05: 4 mg via INTRAVENOUS
  Filled 2017-02-05: qty 2

## 2017-02-05 MED ORDER — DOXYCYCLINE HYCLATE 100 MG PO CAPS: 100.0000 mg | ORAL_CAPSULE | Freq: Two times a day (BID) | ORAL | 0 refills | Status: AC

## 2017-02-05 MED ORDER — ONDANSETRON 4 MG PO TBDP: 4.0000 mg | ORAL_TABLET | Freq: Three times a day (TID) | ORAL | 0 refills | Status: DC | PRN

## 2017-02-05 MED ORDER — SODIUM CHLORIDE 0.9 % IV BOLUS (SEPSIS)
1000.0000 mL | Freq: Once | INTRAVENOUS | Status: AC
  Administered 2017-02-05: 1000 mL via INTRAVENOUS

## 2017-02-05 MED ORDER — METRONIDAZOLE 500 MG PO TABS: 500.0000 mg | ORAL_TABLET | Freq: Three times a day (TID) | ORAL | 0 refills | Status: AC

## 2017-02-05 MED ORDER — AZITHROMYCIN 1 G PO PACK
1.0000 g | PACK | Freq: Once | ORAL | Status: AC
  Administered 2017-02-05: 1 g via ORAL
  Filled 2017-02-05: qty 1

## 2017-02-05 MED ORDER — DICYCLOMINE HCL 20 MG PO TABS: 20.0000 mg | ORAL_TABLET | Freq: Two times a day (BID) | ORAL | 0 refills | Status: DC | PRN

## 2017-02-05 NOTE — ED Triage Notes (Signed)
N/v/d since 4am-NAD-steady gait

## 2017-02-05 NOTE — ED Notes (Signed)
Pt sent to [facility] for U/a attempt x3

## 2017-02-05 NOTE — ED Provider Notes (Signed)
[facility] Provider Note   CSN: [id number] Arrival date & time: 02/05/17  1907  By signing my name below, I, Vanessa Cochran, attest that this documentation has been prepared under the direction and in the presence of physician practitioner, Alvira MondayErin Kiaja Shorty, MD. Electronically Signed: Linna Darnerussell Cochran, Scribe. 02/05/2017. 10:42 PM.  History   Chief Complaint Chief Complaint  Patient presents with  . Diarrhea    The history is provided by the patient. No language interpreter was used.    HPI Comments: Vanessa Cochran is a 21 y.o. female who presents to the Emergency Department complaining of persistent diarrhea beginning around 4 AM this morning. She states she has had more than 10 episodes since onset. Pt reports associated nausea, decreased appetite, and two episodes of vomiting. She notes her mother has similar symptoms. She reports some intermittent abdominal pain for three weeks and associated brown vaginal discharge during this time. Pt reports her LMP began on 2/3 and she bled much more heavily than usual. Pt is sexually active with men and does not use birth control or contraceptives. She has no concern for exposure to STD's. No recent antibiotic use or foreign travel. Pt denies fever, chills, or any other associated symptoms.  Past Medical History:  Diagnosis Date  . Asthma     Patient Active Problem List   Diagnosis Date Noted  . Appendicitis 02/16/2015  . Perforated appendicitis 02/16/2015    Past Surgical History:  Procedure Laterality Date  . APPENDECTOMY    . LAPAROSCOPIC APPENDECTOMY N/A 02/15/2015   Procedure: APPENDECTOMY LAPAROSCOPIC,drainage of abcess;  Surgeon: Ovidio Kinavid Newman, MD;  Location: [facility];  Service: General;  Laterality: N/A;    OB History    No data available       Home Medications    Prior to Admission medications   Medication Sig Start Date End Date Taking? Authorizing Provider  dicyclomine (BENTYL) 20 MG tablet Take 1 tablet (20  mg total) by mouth 2 (two) times daily as needed for spasms. 02/05/17   Alvira MondayErin Onya Eutsler, MD  doxycycline (VIBRAMYCIN) 100 MG capsule Take 1 capsule (100 mg total) by mouth 2 (two) times daily. 02/05/17 02/19/17  Alvira MondayErin Shayanna Thatch, MD  metroNIDAZOLE (FLAGYL) 500 MG tablet Take 1 tablet (500 mg total) by mouth 3 (three) times daily. 02/05/17 02/19/17  Alvira MondayErin Landa Mullinax, MD  ondansetron (ZOFRAN ODT) 4 MG disintegrating tablet Take 1 tablet (4 mg total) by mouth every 8 (eight) hours as needed for nausea or vomiting. 02/05/17   Alvira MondayErin Zaevion Parke, MD    Family History No family history on file.  Social History Social History  Substance Use Topics  . Smoking status: Current Every Day Smoker    Types: Cigars  . Smokeless tobacco: Never Used  . Alcohol use No     Allergies   Patient has no known allergies.   Review of Systems Review of Systems  Constitutional: Positive for appetite change (decreased). Negative for chills and fever.  HENT: Negative for sore throat.   Eyes: Negative for visual disturbance.  Respiratory: Negative for cough and shortness of breath.   Cardiovascular: Negative for chest pain.  Gastrointestinal: Positive for abdominal pain, diarrhea, nausea and vomiting.  Genitourinary: Positive for vaginal discharge. Negative for difficulty urinating.  Musculoskeletal: Negative for back pain and neck pain.  Skin: Negative for rash.  Neurological: Negative for syncope and headaches.     Physical Exam Updated Vital Signs BP 100/58 (BP Location: Left Arm)   Pulse 65   Temp 98.4 F (  36.9 C) (Oral)   Resp 20   Ht 5\' 10"  (1.778 m)   Wt 171 lb (77.6 kg)   LMP 01/24/2017   SpO2 100%   BMI 24.54 kg/m   Physical Exam  Constitutional: She is oriented to person, place, and time. She appears well-developed and well-nourished. No distress.  HENT:  Head: Normocephalic and atraumatic.  Eyes: Conjunctivae and EOM are normal.  Neck: Neck supple. No tracheal deviation present.    Cardiovascular: Normal rate.   Pulmonary/Chest: Effort normal. No respiratory distress.  Genitourinary: Uterus is tender. Cervix exhibits motion tenderness. Left adnexum displays tenderness. Vaginal discharge found.  Genitourinary Comments: Pelvic exam: positive CMT. Uterine tenderness and left adnexal tenderness. Vaginal discharge is yellow/brown.  Musculoskeletal: Normal range of motion.  Neurological: She is alert and oriented to person, place, and time.  Skin: Skin is warm and dry.  Psychiatric: She has a normal mood and affect. Her behavior is normal.  Nursing note and vitals reviewed.    ED Treatments / Results  Labs (all labs ordered are listed, but only abnormal results are displayed) Labs Reviewed  WET PREP, GENITAL - Abnormal; Notable for the following:       Result Value   Clue Cells Wet Prep HPF POC PRESENT (*)    WBC, Wet Prep HPF POC FEW (*)    All other components within normal limits  URINALYSIS, ROUTINE W REFLEX MICROSCOPIC - Abnormal; Notable for the following:    Color, Urine AMBER (*)    APPearance CLOUDY (*)    Specific Gravity, Urine 1.034 (*)    Hgb urine dipstick LARGE (*)    Bilirubin Urine SMALL (*)    Ketones, ur 15 (*)    Protein, ur 100 (*)    All other components within normal limits  CBC - Abnormal; Notable for the following:    Hemoglobin 11.6 (*)    HCT 35.2 (*)    All other components within normal limits  COMPREHENSIVE METABOLIC PANEL - Abnormal; Notable for the following:    Glucose, Bld 102 (*)    ALT 10 (*)    All other components within normal limits  URINALYSIS, MICROSCOPIC (REFLEX) - Abnormal; Notable for the following:    Bacteria, UA FEW (*)    Squamous Epithelial / LPF 6-30 (*)    All other components within normal limits  PREGNANCY, URINE  GC/CHLAMYDIA PROBE AMP ([place]) NOT AT [facility]    EKG  EKG Interpretation None       Radiology No results found.  Procedures Procedures (including critical care  time)  DIAGNOSTIC STUDIES: Oxygen Saturation is 100% on RA, normal by my interpretation.    COORDINATION OF CARE: 10:49 PM Discussed treatment plan with pt at bedside and pt agreed to plan.  Medications Ordered in ED Medications  sodium chloride 0.9 % bolus 1,000 mL (0 mLs Intravenous Stopped 02/05/17 2230)  ondansetron (ZOFRAN) injection 4 mg (4 mg Intravenous Given 02/05/17 2134)  cefTRIAXone (ROCEPHIN) injection 250 mg (250 mg Intramuscular Given 02/05/17 2343)  azithromycin (ZITHROMAX) powder 1 g (1 g Oral Given 02/05/17 2344)     Initial Impression / Assessment and Plan / ED Course  I have reviewed the triage vital signs and the nursing notes.  Pertinent labs & imaging results that were available during my care of the patient were reviewed by me and considered in my medical decision making (see chart for details).     21yo female presents with concern for 3wk of abdominal pain  and vaginal discharge and n/v/diarrhea that began at 4AM. Labs without significant abnormality. UPreg negative. Abdominal exam benign and doubt diverticulitis/appendicitis.  Mom here with similar symptoms of diarrhea/emesis. Suspect viral gastroenteritis. Recommend continued po hydration. Given zofran, bentyl.  Given vaginal discharge, pelvic exam performed showing some uterine tenderness and discharge. Treated empirically for Gc/chl with rocephin/azithromycin, given flagyl/doxycycline for PID.  Wet prep shows BV.  Patient discharged in stable condition with understanding of reasons to return.    Final Clinical Impressions(s) / ED Diagnoses   Final diagnoses:  Gastroenteritis  PID (acute pelvic inflammatory disease), possible  Bacterial vaginosis    New Prescriptions Discharge Medication List as of 02/05/2017 11:33 PM    START taking these medications   Details  dicyclomine (BENTYL) 20 MG tablet Take 1 tablet (20 mg total) by mouth 2 (two) times daily as needed for spasms., Starting Thu 02/05/2017, Print     doxycycline (VIBRAMYCIN) 100 MG capsule Take 1 capsule (100 mg total) by mouth 2 (two) times daily., Starting Thu 02/05/2017, Until Thu 02/19/2017, Print    metroNIDAZOLE (FLAGYL) 500 MG tablet Take 1 tablet (500 mg total) by mouth 3 (three) times daily., Starting Thu 02/05/2017, Until Thu 02/19/2017, Print    ondansetron (ZOFRAN ODT) 4 MG disintegrating tablet Take 1 tablet (4 mg total) by mouth every 8 (eight) hours as needed for nausea or vomiting., Starting Thu 02/05/2017, Print       I personally performed the services described in this documentation, which was scribed in my presence. The recorded information has been reviewed and is accurate.    Alvira Monday, MD 02/06/17 [id number]

## 2017-02-06 LAB — GC/CHLAMYDIA PROBE AMP (~~LOC~~) NOT AT ARMC
CHLAMYDIA, DNA PROBE: NEGATIVE
NEISSERIA GONORRHEA: NEGATIVE

## 2017-08-26 ENCOUNTER — Emergency Department (HOSPITAL_BASED_OUTPATIENT_CLINIC_OR_DEPARTMENT_OTHER): Payer: Self-pay

## 2017-08-26 ENCOUNTER — Encounter (HOSPITAL_BASED_OUTPATIENT_CLINIC_OR_DEPARTMENT_OTHER): Payer: Self-pay | Admitting: Emergency Medicine

## 2017-08-26 ENCOUNTER — Emergency Department (HOSPITAL_BASED_OUTPATIENT_CLINIC_OR_DEPARTMENT_OTHER)
Admission: EM | Admit: 2017-08-26 | Discharge: 2017-08-26 | Disposition: A | Discharge status: Home / Self Care | Payer: Self-pay | Attending: Emergency Medicine | Admitting: Emergency Medicine

## 2017-08-26 DIAGNOSIS — Z79899 Other long term (current) drug therapy: Secondary | ICD-10-CM | POA: Insufficient documentation

## 2017-08-26 DIAGNOSIS — N898 Other specified noninflammatory disorders of vagina: Secondary | ICD-10-CM | POA: Insufficient documentation

## 2017-08-26 DIAGNOSIS — R51 Headache: Secondary | ICD-10-CM | POA: Insufficient documentation

## 2017-08-26 DIAGNOSIS — R938 Abnormal findings on diagnostic imaging of other specified body structures: Secondary | ICD-10-CM | POA: Insufficient documentation

## 2017-08-26 DIAGNOSIS — R1030 Lower abdominal pain, unspecified: Secondary | ICD-10-CM | POA: Insufficient documentation

## 2017-08-26 DIAGNOSIS — R9389 Abnormal findings on diagnostic imaging of other specified body structures: Secondary | ICD-10-CM

## 2017-08-26 DIAGNOSIS — F17290 Nicotine dependence, other tobacco product, uncomplicated: Secondary | ICD-10-CM | POA: Insufficient documentation

## 2017-08-26 DIAGNOSIS — N390 Urinary tract infection, site not specified: Secondary | ICD-10-CM | POA: Insufficient documentation

## 2017-08-26 DIAGNOSIS — R519 Headache, unspecified: Secondary | ICD-10-CM

## 2017-08-26 DIAGNOSIS — R079 Chest pain, unspecified: Secondary | ICD-10-CM | POA: Insufficient documentation

## 2017-08-26 LAB — COMPREHENSIVE METABOLIC PANEL
ALBUMIN: 3.8 g/dL (ref 3.5–5.0)
ALT: 10 U/L — ABNORMAL LOW (ref 14–54)
ANION GAP: 3 — AB (ref 5–15)
AST: 19 U/L (ref 15–41)
Alkaline Phosphatase: 45 U/L (ref 38–126)
BILIRUBIN TOTAL: 0.5 mg/dL (ref 0.3–1.2)
BUN: 11 mg/dL (ref 6–20)
CHLORIDE: 106 mmol/L (ref 101–111)
CO2: 26 mmol/L (ref 22–32)
CREATININE: 0.81 mg/dL (ref 0.44–1.00)
Calcium: 9 mg/dL (ref 8.9–10.3)
GFR calc Af Amer: >60 mL/min (ref >60)
Glucose, Bld: 100 mg/dL — ABNORMAL HIGH (ref 65–99)
POTASSIUM: 4.4 mmol/L (ref 3.5–5.1)
Sodium: 135 mmol/L (ref 135–145)
Total Protein: 7.5 g/dL (ref 6.5–8.1)

## 2017-08-26 LAB — CBC WITH DIFFERENTIAL/PLATELET
BASOS PCT: 0 %
Basophils Absolute: 0 10*3/uL (ref 0.0–0.1)
EOS PCT: 1 %
Eosinophils Absolute: 0.1 10*3/uL (ref 0.0–0.7)
HEMATOCRIT: 31.3 % — AB (ref 36.0–46.0)
Hemoglobin: 10.3 g/dL — ABNORMAL LOW (ref 12.0–15.0)
Lymphocytes Relative: 32 %
Lymphs Abs: 1.3 10*3/uL (ref 0.7–4.0)
MCH: 27.6 pg (ref 26.0–34.0)
MCHC: 32.9 g/dL (ref 30.0–36.0)
MCV: 83.9 fL (ref 78.0–100.0)
MONO ABS: 0.3 10*3/uL (ref 0.1–1.0)
MONOS PCT: 8 %
Neutro Abs: 2.4 10*3/uL (ref 1.7–7.7)
Neutrophils Relative %: 59 %
Platelets: 223 10*3/uL (ref 150–400)
RBC: 3.73 MIL/uL — ABNORMAL LOW (ref 3.87–5.11)
RDW: 16.3 % — AB (ref 11.5–15.5)
WBC: 4.1 10*3/uL (ref 4.0–10.5)

## 2017-08-26 LAB — URINALYSIS, ROUTINE W REFLEX MICROSCOPIC
Bilirubin Urine: NEGATIVE
Glucose, UA: NEGATIVE mg/dL
Ketones, ur: NEGATIVE mg/dL
NITRITE: NEGATIVE
PROTEIN: NEGATIVE mg/dL
Specific Gravity, Urine: 1.025 (ref 1.005–1.030)
pH: 6 (ref 5.0–8.0)

## 2017-08-26 LAB — URINALYSIS, MICROSCOPIC (REFLEX)

## 2017-08-26 LAB — WET PREP, GENITAL
Clue Cells Wet Prep HPF POC: NONE SEEN
SPERM: NONE SEEN
TRICH WET PREP: NONE SEEN
YEAST WET PREP: NONE SEEN

## 2017-08-26 LAB — PREGNANCY, URINE: PREG TEST UR: NEGATIVE

## 2017-08-26 MED ORDER — DOXYCYCLINE HYCLATE 100 MG PO CAPS: 100.0000 mg | ORAL_CAPSULE | Freq: Two times a day (BID) | ORAL | 0 refills | Status: AC

## 2017-08-26 MED ORDER — FOSFOMYCIN TROMETHAMINE 3 G PO PACK
3.0000 g | PACK | Freq: Once | ORAL | Status: AC
  Administered 2017-08-26: 3 g via ORAL
  Filled 2017-08-26: qty 3

## 2017-08-26 MED ORDER — KETOROLAC TROMETHAMINE 30 MG/ML IJ SOLN
30.0000 mg | Freq: Once | INTRAMUSCULAR | Status: AC
  Administered 2017-08-26: 30 mg via INTRAVENOUS
  Filled 2017-08-26: qty 1

## 2017-08-26 MED ORDER — ONDANSETRON HCL 4 MG/2ML IJ SOLN
4.0000 mg | Freq: Once | INTRAMUSCULAR | Status: AC
  Administered 2017-08-26: 4 mg via INTRAVENOUS
  Filled 2017-08-26: qty 2

## 2017-08-26 MED ORDER — CEFTRIAXONE SODIUM 250 MG IJ SOLR
INTRAMUSCULAR | Status: AC
  Administered 2017-08-26: 250 mg
  Filled 2017-08-26: qty 250

## 2017-08-26 MED ORDER — DEXTROSE 5 % IV SOLN
250.0000 mg | Freq: Once | INTRAVENOUS | Status: DC
  Filled 2017-08-26: qty 250

## 2017-08-26 NOTE — ED Provider Notes (Signed)
[facility]   CSN: [id number] Arrival date & time: 08/26/17  1339     History   Chief Complaint Chief Complaint  Patient presents with  . Headache    abd pain    HPI Vanessa Cochran is a 21 y.o. female.  21yo F w/ history of appendectomy who presents with multiple complaints. The patient reports a 1 week of gradual onset of headache that has been constant, associated with mild photophobia. No recent head injury. No associated fevers, neck stiffness, nausea, vomiting, or recent illness. She also reports intermittent left lower abdominal pain for the past one month. The pain occurs randomly, nothing makes it better or worse. She reports she also sometimes has associated low back pain. She denies any urinary symptoms. She has had vaginal discharge for the past one week. She is sexually active with one partner and does not use protection. She reports chronic history of random occasional chest tightness not associated with shortness of breath. It is not associated with exertion. No recent travel or OCP use.   The history is provided by the patient.  Headache      Past Medical History:  Diagnosis Date  . Asthma     Patient Active Problem List   Diagnosis Date Noted  . Appendicitis 02/16/2015  . Perforated appendicitis 02/16/2015    Past Surgical History:  Procedure Laterality Date  . APPENDECTOMY    . LAPAROSCOPIC APPENDECTOMY N/A 02/15/2015   Procedure: APPENDECTOMY LAPAROSCOPIC,drainage of abcess;  Surgeon: Ovidio Kin, MD;  Location: [facility];  Service: General;  Laterality: N/A;    OB History    No data available       Home Medications    Prior to Admission medications   Medication Sig Start Date End Date Taking? Authorizing Provider  dicyclomine (BENTYL) 20 MG tablet Take 1 tablet (20 mg total) by mouth 2 (two) times daily as needed for spasms. 02/05/17   Alvira Monday, MD  ondansetron (ZOFRAN ODT) 4 MG disintegrating tablet Take 1 tablet  (4 mg total) by mouth every 8 (eight) hours as needed for nausea or vomiting. 02/05/17   Alvira Monday, MD    Family History History reviewed. No pertinent family history.  Social History Social History  Substance Use Topics  . Smoking status: Current Every Day Smoker    Types: Cigars  . Smokeless tobacco: Never Used  . Alcohol use No     Allergies   Patient has no known allergies.   Review of Systems Review of Systems  Neurological: Positive for headaches.   All other systems reviewed and are negative except that which was mentioned in HPI   Physical Exam Updated Vital Signs BP 115/74 (BP Location: Left Arm)   Pulse 60   Temp 98.6 F (37 C) (Oral)   Resp 18   Ht 5\' 10"  (1.778 m)   Wt 77.1 kg (170 lb)   LMP 08/19/2017   SpO2 100%   BMI 24.39 kg/m   Physical Exam  Constitutional: She is oriented to person, place, and time. She appears well-developed and well-nourished. No distress.  HENT:  Head: Normocephalic and atraumatic.  Mouth/Throat: Oropharynx is clear and moist.  Moist mucous membranes  Eyes: Pupils are equal, round, and reactive to light. Conjunctivae are normal.  Neck: Neck supple.  Cardiovascular: Normal rate, regular rhythm and normal heart sounds.   No murmur heard. Pulmonary/Chest: Effort normal and breath sounds normal.  Abdominal: Soft. Bowel sounds are normal. She exhibits no distension.  There is tenderness (suprapubic, LLQ). There is no rebound and no guarding.  Genitourinary: Vaginal discharge found.  Genitourinary Comments: Copious clear-yellow thick discharge in vaginal vault, + cervical motion tenderness and L adnexal tenderness  Musculoskeletal: She exhibits no edema.  Neurological: She is alert and oriented to person, place, and time.  Fluent speech  Skin: Skin is warm and dry.  Psychiatric: She has a normal mood and affect. Judgment normal.  Nursing note and vitals reviewed. Chaperone was present during exam.    ED Treatments  / Results  Labs (all labs ordered are listed, but only abnormal results are displayed) Labs Reviewed  URINALYSIS, ROUTINE W REFLEX MICROSCOPIC - Abnormal; Notable for the following:       Result Value   APPearance HAZY (*)    Hgb urine dipstick LARGE (*)    Leukocytes, UA TRACE (*)    All other components within normal limits  URINALYSIS, MICROSCOPIC (REFLEX) - Abnormal; Notable for the following:    Bacteria, UA MANY (*)    Squamous Epithelial / LPF 0-5 (*)    All other components within normal limits  CBC WITH DIFFERENTIAL/PLATELET - Abnormal; Notable for the following:    RBC 3.73 (*)    Hemoglobin 10.3 (*)    HCT 31.3 (*)    RDW 16.3 (*)    All other components within normal limits  COMPREHENSIVE METABOLIC PANEL - Abnormal; Notable for the following:    Glucose, Bld 100 (*)    ALT 10 (*)    Anion gap 3 (*)    All other components within normal limits  WET PREP, GENITAL  PREGNANCY, URINE  RPR  HIV ANTIBODY (ROUTINE TESTING)  GC/CHLAMYDIA PROBE AMP ([place]) NOT AT [facility]    EKG  EKG Interpretation None       Radiology No results found.  Procedures Procedures (including critical care time)  Medications Ordered in ED Medications - No data to display   Initial Impression / Assessment and Plan / ED Course  I have reviewed the triage vital signs and the nursing notes.  Pertinent labs & imaging results that were available during my care of the patient were reviewed by me and considered in my medical decision making (see chart for details).     Pt w/ multiple complaints including 1 week of headache, One month of intermittent lower abdominal pain without other symptoms, and one week of vaginal discharge. No urinary symptoms. She was comfortable on exam with normal vital signs. She had suprapubic and left lower quadrant tenderness. She did have a large amount of vaginal discharge with cervical motion tenderness and left adnexal tenderness. Because of her abnormal  pelvic exam, obtained pelvic ultrasound to rule out TOA or other ovarian pathology. Gave toradol for headache.  Labs show reassuring CMP and CBC, UA with bacteria and some WBC, sent culture. Her [place] is negative for ovarian pathology but does show abnormal endometrium. I discussed this with her; she was told this in the past but never followed up. I explained importance of seeing OBGYN for further work up of this issue as she may need endometrial biopsy. [place] did note some debris in bladder. Gave fosfomycin for UTI, CTX and doxycycline to cover PID because of sexual activity and abnormal pelvic exam. [facility] Had a long discussion educating patient on importance of safe sex practices and some form of contraception as she is not trying to conceive. Reviewed return precautions. PT voiced understanding and was discharged  in satisfactory condition.   Final Clinical Impressions(s) / ED Diagnoses   Final diagnoses:  None    New Prescriptions New Prescriptions   No medications on file     Jaquanda Wickersham, Ambrose Finlandachel Morgan, MD 08/27/17 2214

## 2017-08-26 NOTE — ED Notes (Signed)
ED Provider at bedside. 

## 2017-08-26 NOTE — ED Notes (Signed)
Patient transported to Ultrasound 

## 2017-08-26 NOTE — ED Triage Notes (Signed)
Patient states that she has had a generalized headache x 1 week. The patient also reports that she is having lumbar pain and left side abdominal pain "for a while, like a month" THe patient denies any N/V. Denies any SOB and is texting on her phone during triage

## 2017-08-27 LAB — URINE CULTURE: SPECIAL REQUESTS: NORMAL

## 2017-08-27 LAB — HIV ANTIBODY (ROUTINE TESTING W REFLEX): HIV Screen 4th Generation wRfx: NONREACTIVE

## 2017-08-27 LAB — GC/CHLAMYDIA PROBE AMP (~~LOC~~) NOT AT ARMC
Chlamydia: NEGATIVE
Neisseria Gonorrhea: NEGATIVE

## 2017-08-27 LAB — RPR: RPR: NONREACTIVE

## 2017-09-04 NOTE — ED Notes (Signed)
Pt. Called for her STD results.  Results reviewed with the pt.  09/04/2017

## 2018-03-28 ENCOUNTER — Encounter (HOSPITAL_BASED_OUTPATIENT_CLINIC_OR_DEPARTMENT_OTHER): Payer: Self-pay | Admitting: Adult Health

## 2018-03-28 ENCOUNTER — Other Ambulatory Visit: Payer: Self-pay

## 2018-03-28 ENCOUNTER — Emergency Department (HOSPITAL_BASED_OUTPATIENT_CLINIC_OR_DEPARTMENT_OTHER)
Admission: EM | Admit: 2018-03-28 | Discharge: 2018-03-28 | Disposition: A | Discharge status: Home / Self Care | Payer: Self-pay | Attending: Emergency Medicine | Admitting: Emergency Medicine

## 2018-03-28 DIAGNOSIS — N898 Other specified noninflammatory disorders of vagina: Secondary | ICD-10-CM | POA: Insufficient documentation

## 2018-03-28 DIAGNOSIS — Z113 Encounter for screening for infections with a predominantly sexual mode of transmission: Secondary | ICD-10-CM | POA: Insufficient documentation

## 2018-03-28 DIAGNOSIS — Z5321 Procedure and treatment not carried out due to patient leaving prior to being seen by health care provider: Secondary | ICD-10-CM | POA: Insufficient documentation

## 2018-03-28 NOTE — ED Provider Notes (Signed)
[facility] EMERGENCY DEPARTMENT Provider Note   CSN: [id number] Arrival date & time: 03/28/18  1707     History   Chief Complaint Chief Complaint  Patient presents with  . Exposure to STD    HPI Vanessa Cochran is a 22 y.o. female who presents for evaluation of an STD check.  Patient reports that she was here with a family member who is being evaluated for another concern and stated that she wanted to get checked for an STD since she had it for a long time.  Patient states that she was not having any vaginal discharge.  She does note that she has been having some odor but denies any discharge, dysuria, hematuria.  Patient reports she is currently sexually active and they do not use protection.  Patient Nuys any fevers, abdominal pain.  The history is provided by the patient.    Past Medical History:  Diagnosis Date  . Asthma     Patient Active Problem List   Diagnosis Date Noted  . Appendicitis 02/16/2015  . Perforated appendicitis 02/16/2015    Past Surgical History:  Procedure Laterality Date  . APPENDECTOMY    . LAPAROSCOPIC APPENDECTOMY N/A 02/15/2015   Procedure: APPENDECTOMY LAPAROSCOPIC,drainage of abcess;  Surgeon: Vanessa Kinavid Newman, MD;  Location: [facility];  Service: General;  Laterality: N/A;     OB History   None      Home Medications    Prior to Admission medications   Medication Sig Start Date End Date Taking? Authorizing Provider  dicyclomine (BENTYL) 20 MG tablet Take 1 tablet (20 mg total) by mouth 2 (two) times daily as needed for spasms. 02/05/17   Alvira MondaySchlossman, Erin, MD  ondansetron (ZOFRAN ODT) 4 MG disintegrating tablet Take 1 tablet (4 mg total) by mouth every 8 (eight) hours as needed for nausea or vomiting. 02/05/17   Alvira MondaySchlossman, Erin, MD    Family History History reviewed. No pertinent family history.  Social History Social History   Tobacco Use  . Smoking status: Current Every Day Smoker    Types: Cigars  . Smokeless tobacco: Never  Used  Substance Use Topics  . Alcohol use: No  . Drug use: No     Allergies   Patient has no known allergies.   Review of Systems Review of Systems  Constitutional: Negative for fever.  Gastrointestinal: Negative for abdominal pain.  Genitourinary: Negative for dysuria, hematuria, vaginal bleeding and vaginal discharge.     Physical Exam Updated Vital Signs BP 116/69   Pulse 79   Temp 98.2 F (36.8 C) (Oral)   Resp 18   LMP 03/08/2018 (Approximate)   SpO2 100%   Physical Exam  Constitutional: She appears well-developed and well-nourished.  HENT:  Head: Normocephalic and atraumatic.  Eyes: Conjunctivae and EOM are normal. Right eye exhibits no discharge. Left eye exhibits no discharge. No scleral icterus.  Pulmonary/Chest: Effort normal.  Abdominal: Soft. Normal appearance. There is no tenderness.  Abdomen is soft, non-distended, non-tender.   Neurological: She is alert.  Skin: Skin is warm and dry.  Psychiatric: She has a normal mood and affect. Her speech is normal and behavior is normal.  Nursing note and vitals reviewed.    ED Treatments / Results  Labs (all labs ordered are listed, but only abnormal results are displayed) Labs Reviewed  PREGNANCY, URINE  GC/CHLAMYDIA PROBE AMP ([place]) NOT AT [facility]    EKG None  Radiology No results found.  Procedures Procedures (including critical care time)  Medications  Ordered in ED Medications - No data to display   Initial Impression / Assessment and Plan / ED Course  I have reviewed the triage vital signs and the nursing notes.  Pertinent labs & imaging results that were available during my care of the patient were reviewed by me and considered in my medical decision making (see chart for details).     22 y.o. F who presents for evaluation of STD check.  Patient reports that she was here with another family member decided to get an STD check because she had had one in a long time.  Patient reports  she had not had any symptoms.  She states that she may have noticed an odor but denies any vaginal discharge.  Patient is currently sexually active and they do not use any protection.  Patient states she was not notified of any STD exposure.  Patient denies any fevers, abdominal pain, dysuria, hematuria. Patient is afebrile, non-toxic appearing, sitting comfortably on examination table. Vital signs reviewed and stable.  Patient exhibits no abdominal tenderness on exam.  Given that patient is not symptomatic and has no known exposure, will plan to check urine pregnancy and urine GC/chlamydia.  Discussed plan with patient and she was agreeable.  RN informed me that patient left prior to providing urine sample.  I was not approached until patient had Artie left the department and did not have get a chance to discuss with patient.  Final Clinical Impressions(s) / ED Diagnoses   Final diagnoses:  Screen for STD (sexually transmitted disease)    ED Discharge Orders    None       Rosana Hoes 03/28/18 2007    Rolan Bucco, MD 03/28/18 2312

## 2018-03-28 NOTE — ED Triage Notes (Addendum)
Pt states, "I am here with my cousin and I haven't been tested for STDs for a long time, so I decided to check in and be tested. Pt denies symptoms or knowledge of contact with a person with STDs" She does not use condoms or anything for birth control.

## 2018-03-28 NOTE — ED Notes (Signed)
Pt approached nursing station and reported "I am going to leave, this is pointless".

## 2018-05-06 ENCOUNTER — Emergency Department (HOSPITAL_BASED_OUTPATIENT_CLINIC_OR_DEPARTMENT_OTHER)
Admission: EM | Admit: 2018-05-06 | Discharge: 2018-05-06 | Disposition: A | Discharge status: Home / Self Care | Payer: Self-pay | Attending: Physician Assistant | Admitting: Physician Assistant

## 2018-05-06 ENCOUNTER — Other Ambulatory Visit: Payer: Self-pay

## 2018-05-06 ENCOUNTER — Encounter (HOSPITAL_BASED_OUTPATIENT_CLINIC_OR_DEPARTMENT_OTHER): Payer: Self-pay | Admitting: Adult Health

## 2018-05-06 DIAGNOSIS — J45909 Unspecified asthma, uncomplicated: Secondary | ICD-10-CM | POA: Insufficient documentation

## 2018-05-06 DIAGNOSIS — B9689 Other specified bacterial agents as the cause of diseases classified elsewhere: Secondary | ICD-10-CM | POA: Insufficient documentation

## 2018-05-06 DIAGNOSIS — N760 Acute vaginitis: Secondary | ICD-10-CM | POA: Insufficient documentation

## 2018-05-06 DIAGNOSIS — F17290 Nicotine dependence, other tobacco product, uncomplicated: Secondary | ICD-10-CM | POA: Insufficient documentation

## 2018-05-06 LAB — WET PREP, GENITAL
SPERM: NONE SEEN
Trich, Wet Prep: NONE SEEN
Yeast Wet Prep HPF POC: NONE SEEN

## 2018-05-06 LAB — URINALYSIS, ROUTINE W REFLEX MICROSCOPIC
BILIRUBIN URINE: NEGATIVE
GLUCOSE, UA: NEGATIVE mg/dL
Hgb urine dipstick: NEGATIVE
KETONES UR: NEGATIVE mg/dL
Leukocytes, UA: NEGATIVE
NITRITE: NEGATIVE
PROTEIN: NEGATIVE mg/dL
Specific Gravity, Urine: >1.03 — ABNORMAL HIGH (ref 1.005–1.030)
pH: 6 (ref 5.0–8.0)

## 2018-05-06 LAB — PREGNANCY, URINE: Preg Test, Ur: NEGATIVE

## 2018-05-06 MED ORDER — CLINDAMYCIN PHOSPHATE 1 % EX GEL: Freq: Two times a day (BID) | CUTANEOUS | 0 refills | Status: DC

## 2018-05-06 MED ORDER — METRONIDAZOLE 500 MG PO TABS: 500.0000 mg | ORAL_TABLET | Freq: Two times a day (BID) | ORAL | 0 refills | Status: DC

## 2018-05-06 MED ORDER — METRONIDAZOLE 500 MG PO TABS
500.0000 mg | ORAL_TABLET | Freq: Once | ORAL | Status: AC
  Administered 2018-05-06: 500 mg via ORAL
  Filled 2018-05-06: qty 1

## 2018-05-06 NOTE — ED Notes (Signed)
ED Provider at bedside. 

## 2018-05-06 NOTE — ED Triage Notes (Signed)
Presents with vaginal d/c that is runny and white and odor for 1 week associated left lower abdominal pain. LMP 04/28/18.

## 2018-05-06 NOTE — Discharge Instructions (Signed)
You are found to have bacterial vaginosis today.  As we discussed this could be from a change in recent product use, could be from a change in your pH caused by new sexual relations.  Please take the medication to help with your symptoms.  Follow-up with your primary care.  You will be contacted if your gonorrhea or chlamydia are positive.

## 2018-05-06 NOTE — ED Provider Notes (Signed)
[facility] EMERGENCY DEPARTMENT Provider Note   CSN: [id number] Arrival date & time: 05/06/18  1402     History   Chief Complaint Chief Complaint  Patient presents with  . Vaginal Discharge    HPI Vanessa Cochran is a 22 y.o. female.  HPI   22 year old female with vaginal discharge.  She reports a clear malodorous discharge for the last 3 to 4 days to week.  Occasional cramping feeling.  No unilateral pain.  Reports being sexually active.  No rashes nausea vomiting or other symptoms.   Past Medical History:  Diagnosis Date  . Asthma     Patient Active Problem List   Diagnosis Date Noted  . Appendicitis 02/16/2015  . Perforated appendicitis 02/16/2015    Past Surgical History:  Procedure Laterality Date  . APPENDECTOMY    . LAPAROSCOPIC APPENDECTOMY N/A 02/15/2015   Procedure: APPENDECTOMY LAPAROSCOPIC,drainage of abcess;  Surgeon: Ovidio Kin, MD;  Location: [facility];  Service: General;  Laterality: N/A;     OB History   None      Home Medications    Prior to Admission medications   Medication Sig Start Date End Date Taking? Authorizing Provider  dicyclomine (BENTYL) 20 MG tablet Take 1 tablet (20 mg total) by mouth 2 (two) times daily as needed for spasms. 02/05/17   Alvira Monday, MD  ondansetron (ZOFRAN ODT) 4 MG disintegrating tablet Take 1 tablet (4 mg total) by mouth every 8 (eight) hours as needed for nausea or vomiting. 02/05/17   Alvira Monday, MD    Family History History reviewed. No pertinent family history.  Social History Social History   Tobacco Use  . Smoking status: Current Every Day Smoker    Types: Cigars  . Smokeless tobacco: Never Used  Substance Use Topics  . Alcohol use: No  . Drug use: No     Allergies   Patient has no known allergies.   Review of Systems Review of Systems  Constitutional: Negative for activity change.  Respiratory: Negative for shortness of breath.   Cardiovascular: Negative for  chest pain.  Gastrointestinal: Negative for abdominal pain.  Genitourinary: Positive for vaginal discharge.  All other systems reviewed and are negative.    Physical Exam Updated Vital Signs BP 120/77 (BP Location: Left Arm)   Pulse 65   Temp 98.4 F (36.9 C) (Oral)   Resp 18   Ht  (1.778 m)   Wt 77.1 kg (170 lb)   LMP 04/28/2018 (Exact Date)   SpO2 100%   BMI 24.39 kg/m   Physical Exam  Constitutional: She is oriented to person, place, and time. She appears well-developed and well-nourished.  HENT:  Head: Normocephalic and atraumatic.  Eyes: Right eye exhibits no discharge.  Cardiovascular: Normal rate.  Pulmonary/Chest: Effort normal.  Abdominal: There is no tenderness.  Genitourinary:  Genitourinary Comments: Cervix appears normal, significant amount of clearish discharge.  Neurological: She is oriented to person, place, and time.  Skin: Skin is warm and dry. She is not diaphoretic.  Psychiatric: She has a normal mood and affect.  Nursing note and vitals reviewed.    ED Treatments / Results  Labs (all labs ordered are listed, but only abnormal results are displayed) Labs Reviewed  URINALYSIS, ROUTINE W REFLEX MICROSCOPIC - Abnormal; Notable for the following components:      Result Value   Specific Gravity, Urine >1.030 (*)    All other components within normal limits  WET PREP, GENITAL  PREGNANCY, URINE  RPR  HIV ANTIBODY (ROUTINE TESTING)  GC/CHLAMYDIA PROBE AMP ([place]) NOT AT [facility]    EKG None  Radiology No results found.  Procedures Procedures (including critical care time)  Medications Ordered in ED Medications - No data to display   Initial Impression / Assessment and Plan / ED Course  I have reviewed the triage vital signs and the nursing notes.  Pertinent labs & imaging results that were available during my care of the patient were reviewed by me and considered in my medical decision making (see chart for details).      22 year old female with vaginal discharge.  She reports a clear malodorous discharge for the last 3 to 4 days to week.  Occasional cramping feeling.  No unilateral pain.  Reports being sexually active.  No rashes nausea vomiting or other symptoms.  4:11 PM BV positive. Will treat.  Patient reports she thinks she might of had some tongue tingling with this medication in the past.  We offered her a separate prescription for clindamycin intravaginally in case this were to happen with this medication.  Final Clinical Impressions(s) / ED Diagnoses   Final diagnoses:  None    ED Discharge Orders    None       Abelino Derrick, MD 05/06/18 2308

## 2018-05-07 LAB — RPR: RPR Ser Ql: NONREACTIVE

## 2018-05-07 LAB — HIV ANTIBODY (ROUTINE TESTING W REFLEX): HIV Screen 4th Generation wRfx: NONREACTIVE

## 2018-05-07 LAB — GC/CHLAMYDIA PROBE AMP (~~LOC~~) NOT AT ARMC
Chlamydia: NEGATIVE
Neisseria Gonorrhea: NEGATIVE

## 2019-04-20 ENCOUNTER — Encounter (HOSPITAL_BASED_OUTPATIENT_CLINIC_OR_DEPARTMENT_OTHER): Payer: Self-pay

## 2019-04-20 ENCOUNTER — Emergency Department (HOSPITAL_BASED_OUTPATIENT_CLINIC_OR_DEPARTMENT_OTHER)
Admission: EM | Admit: 2019-04-20 | Discharge: 2019-04-20 | Disposition: A | Discharge status: Home / Self Care | Payer: Self-pay | Attending: Emergency Medicine | Admitting: Emergency Medicine

## 2019-04-20 ENCOUNTER — Other Ambulatory Visit: Payer: Self-pay

## 2019-04-20 DIAGNOSIS — F17210 Nicotine dependence, cigarettes, uncomplicated: Secondary | ICD-10-CM | POA: Insufficient documentation

## 2019-04-20 DIAGNOSIS — J45909 Unspecified asthma, uncomplicated: Secondary | ICD-10-CM | POA: Insufficient documentation

## 2019-04-20 DIAGNOSIS — F17290 Nicotine dependence, other tobacco product, uncomplicated: Secondary | ICD-10-CM | POA: Insufficient documentation

## 2019-04-20 DIAGNOSIS — Z202 Contact with and (suspected) exposure to infections with a predominantly sexual mode of transmission: Secondary | ICD-10-CM | POA: Insufficient documentation

## 2019-04-20 LAB — URINALYSIS, ROUTINE W REFLEX MICROSCOPIC
Bilirubin Urine: NEGATIVE
Glucose, UA: NEGATIVE mg/dL
Hgb urine dipstick: NEGATIVE
Ketones, ur: NEGATIVE mg/dL
Leukocytes,Ua: NEGATIVE
Nitrite: NEGATIVE
Protein, ur: NEGATIVE mg/dL
Specific Gravity, Urine: 1.02 (ref 1.005–1.030)
pH: 6.5 (ref 5.0–8.0)

## 2019-04-20 LAB — PREGNANCY, URINE: Preg Test, Ur: NEGATIVE

## 2019-04-20 MED ORDER — METRONIDAZOLE 500 MG PO TABS
2000.0000 mg | ORAL_TABLET | Freq: Once | ORAL | Status: AC
  Administered 2019-04-20: 2000 mg via ORAL
  Filled 2019-04-20: qty 4

## 2019-04-20 MED ORDER — CEFTRIAXONE SODIUM 250 MG IJ SOLR
250.0000 mg | Freq: Once | INTRAMUSCULAR | Status: AC
Start: 2019-04-20 — End: 2019-04-20
  Administered 2019-04-20: 250 mg via INTRAMUSCULAR
  Filled 2019-04-20: qty 250

## 2019-04-20 MED ORDER — LIDOCAINE HCL (PF) 1 % IJ SOLN
INTRAMUSCULAR | Status: AC
  Filled 2019-04-20: qty 5

## 2019-04-20 MED ORDER — ONDANSETRON 4 MG PO TBDP
4.0000 mg | ORAL_TABLET | Freq: Once | ORAL | Status: AC
  Administered 2019-04-20: 4 mg via ORAL
  Filled 2019-04-20: qty 1

## 2019-04-20 MED ORDER — AZITHROMYCIN 1 G PO PACK
1.0000 g | PACK | Freq: Once | ORAL | Status: AC
  Administered 2019-04-20: 1 g via ORAL
  Filled 2019-04-20: qty 1

## 2019-04-20 NOTE — ED Triage Notes (Signed)
Pt reports exposure to genital herpes-denies-denies sores-NAD-steady gait

## 2019-04-20 NOTE — ED Notes (Signed)
Pt understood dc material. NAD noted. All questions answered to satisfaction. Pt escorted to check out counter. 

## 2019-04-21 NOTE — ED Provider Notes (Signed)
[facility] EMERGENCY DEPARTMENT Provider Note   CSN: [id number] Arrival date & time: 04/20/19  1630    History   Chief Complaint Chief Complaint  Patient presents with  . Exposure to STD    HPI Darrol JumpKeisha Turk is a 23 y.o. female.     HPI   23 year old female who presents to the emergency department with concern for exposure to HSV.  She denies having any specific symptoms, denies rash, vaginal discharge, nausea or vomiting.  Reports she has some mild chronic abdominal pain which is unchanged.  No dysuria or frequency. Reports sexual activity with someone having active outbreak and would like HSV testing.   Past Medical History:  Diagnosis Date  . Asthma     Patient Active Problem List   Diagnosis Date Noted  . Appendicitis 02/16/2015  . Perforated appendicitis 02/16/2015    Past Surgical History:  Procedure Laterality Date  . APPENDECTOMY    . LAPAROSCOPIC APPENDECTOMY N/A 02/15/2015   Procedure: APPENDECTOMY LAPAROSCOPIC,drainage of abcess;  Surgeon: Ovidio Kinavid Newman, MD;  Location: [facility];  Service: General;  Laterality: N/A;     OB History   No obstetric history on file.      Home Medications    Prior to Admission medications   Medication Sig Start Date End Date Taking? Authorizing Provider  clindamycin (CLINDAGEL) 1 % gel Apply topically 2 (two) times daily. 05/06/18   Mackuen, Courteney Lyn, MD  dicyclomine (BENTYL) 20 MG tablet Take 1 tablet (20 mg total) by mouth 2 (two) times daily as needed for spasms. 02/05/17   Alvira MondaySchlossman, Markela Wee, MD  metroNIDAZOLE (FLAGYL) 500 MG tablet Take 1 tablet (500 mg total) by mouth 2 (two) times daily. 05/06/18   Mackuen, Courteney Lyn, MD  ondansetron (ZOFRAN ODT) 4 MG disintegrating tablet Take 1 tablet (4 mg total) by mouth every 8 (eight) hours as needed for nausea or vomiting. 02/05/17   Alvira MondaySchlossman, Makalyn Lennox, MD    Family History No family history on file.  Social History Social History   Tobacco Use  . Smoking  status: Current Every Day Smoker    Types: Cigars, Cigarettes  . Smokeless tobacco: Never Used  Substance Use Topics  . Alcohol use: Yes    Comment: daily  . Drug use: No     Allergies   Patient has no known allergies.   Review of Systems Review of Systems  Gastrointestinal: Positive for abdominal pain (reports some chronic abdominal pain but no specific pain or concerns today). Negative for nausea and vomiting.  Genitourinary: Negative for decreased urine volume, dysuria, urgency and vaginal discharge.  Skin: Negative for rash.     Physical Exam Updated Vital Signs BP 130/83   Pulse 76   Temp 98.4 F (36.9 C) (Oral)   Resp 18   Ht 5\' 10"  (1.778 m)   Wt 79.4 kg   LMP 04/12/2019   SpO2 100%   BMI 25.11 kg/m   Physical Exam Vitals signs and nursing note reviewed.  Constitutional:      General: She is not in acute distress.    Appearance: She is well-developed. She is not diaphoretic.  HENT:     Head: Normocephalic and atraumatic.  Eyes:     Conjunctiva/sclera: Conjunctivae normal.  Neck:     Musculoskeletal: Normal range of motion.  Cardiovascular:     Rate and Rhythm: Normal rate.  Pulmonary:     Effort: Pulmonary effort is normal. No respiratory distress.  Skin:    General: Skin is  warm and dry.  Neurological:     Mental Status: She is alert and oriented to person, place, and time.      ED Treatments / Results  Labs (all labs ordered are listed, but only abnormal results are displayed) Labs Reviewed  URINALYSIS, ROUTINE W REFLEX MICROSCOPIC  PREGNANCY, URINE    EKG None  Radiology No results found.  Procedures Procedures (including critical care time)  Medications Ordered in ED Medications  cefTRIAXone (ROCEPHIN) injection 250 mg (250 mg Intramuscular Given 04/20/19 1956)  azithromycin (ZITHROMAX) powder 1 g (1 g Oral Given 04/20/19 1956)  metroNIDAZOLE (FLAGYL) tablet 2,000 mg (2,000 mg Oral Given 04/20/19 1955)  ondansetron (ZOFRAN-ODT)  disintegrating tablet 4 mg (4 mg Oral Given 04/20/19 1955)     Initial Impression / Assessment and Plan / ED Course  I have reviewed the triage vital signs and the nursing notes.  Pertinent labs & imaging results that were available during my care of the patient were reviewed by me and considered in my medical decision making (see chart for details).       22 year old female who presents to the emergency department with concern for exposure to HSV.  Pulled patient from the waiting room myself given her wait and no available beds to discuss her case and apologized for the wait.  Discussed that while she is concerned regarding HSV exposure, testing in asymptomatic patients is not indicated and discussed that I had reviewed these recommendations prior to our discussion. She is asymptomatic at this time.  Offered further testing for other STIs, however she declines.  Offered empiric treatment for gonorrhea chlamydia and trichomonas which she accepts and was given Rocephin, azithromycin and Flagyl. Patient discharged in stable condition.  Final Clinical Impressions(s) / ED Diagnoses   Final diagnoses:  STD exposure    ED Discharge Orders    None       Alvira Monday, MD 04/21/19 [phone]

## 2019-06-16 ENCOUNTER — Other Ambulatory Visit: Payer: Self-pay

## 2019-06-16 ENCOUNTER — Encounter (HOSPITAL_BASED_OUTPATIENT_CLINIC_OR_DEPARTMENT_OTHER): Payer: Self-pay | Admitting: Emergency Medicine

## 2019-06-16 ENCOUNTER — Emergency Department (HOSPITAL_BASED_OUTPATIENT_CLINIC_OR_DEPARTMENT_OTHER)
Admission: EM | Admit: 2019-06-16 | Discharge: 2019-06-16 | Disposition: A | Discharge status: Home / Self Care | Payer: Self-pay | Attending: Emergency Medicine | Admitting: Emergency Medicine

## 2019-06-16 DIAGNOSIS — F17290 Nicotine dependence, other tobacco product, uncomplicated: Secondary | ICD-10-CM | POA: Insufficient documentation

## 2019-06-16 DIAGNOSIS — J45909 Unspecified asthma, uncomplicated: Secondary | ICD-10-CM | POA: Insufficient documentation

## 2019-06-16 DIAGNOSIS — N939 Abnormal uterine and vaginal bleeding, unspecified: Secondary | ICD-10-CM | POA: Insufficient documentation

## 2019-06-16 DIAGNOSIS — R1030 Lower abdominal pain, unspecified: Secondary | ICD-10-CM

## 2019-06-16 DIAGNOSIS — F17210 Nicotine dependence, cigarettes, uncomplicated: Secondary | ICD-10-CM | POA: Insufficient documentation

## 2019-06-16 LAB — CBC WITH DIFFERENTIAL/PLATELET
Abs Immature Granulocytes: 0.01 10*3/uL (ref 0.00–0.07)
Basophils Absolute: 0 10*3/uL (ref 0.0–0.1)
Basophils Relative: 0 %
Eosinophils Absolute: 0 10*3/uL (ref 0.0–0.5)
Eosinophils Relative: 0 %
HCT: 38.7 % (ref 36.0–46.0)
Hemoglobin: 12.2 g/dL (ref 12.0–15.0)
Immature Granulocytes: 0 %
Lymphocytes Relative: 48 %
Lymphs Abs: 1.4 10*3/uL (ref 0.7–4.0)
MCH: 27.7 pg (ref 26.0–34.0)
MCHC: 31.5 g/dL (ref 30.0–36.0)
MCV: 87.8 fL (ref 80.0–100.0)
Monocytes Absolute: 0.5 10*3/uL (ref 0.1–1.0)
Monocytes Relative: 17 %
Neutro Abs: 1 10*3/uL — ABNORMAL LOW (ref 1.7–7.7)
Neutrophils Relative %: 35 %
Platelets: 191 10*3/uL (ref 150–400)
RBC: 4.41 MIL/uL (ref 3.87–5.11)
RDW: 14.8 % (ref 11.5–15.5)
WBC: 2.9 10*3/uL — ABNORMAL LOW (ref 4.0–10.5)
nRBC: 0 % (ref 0.0–0.2)

## 2019-06-16 LAB — COMPREHENSIVE METABOLIC PANEL
ALT: 16 U/L (ref 0–44)
AST: 23 U/L (ref 15–41)
Albumin: 4.1 g/dL (ref 3.5–5.0)
Alkaline Phosphatase: 54 U/L (ref 38–126)
Anion gap: 7 (ref 5–15)
BUN: 11 mg/dL (ref 6–20)
CO2: 22 mmol/L (ref 22–32)
Calcium: 9.2 mg/dL (ref 8.9–10.3)
Chloride: 108 mmol/L (ref 98–111)
Creatinine, Ser: 0.88 mg/dL (ref 0.44–1.00)
GFR calc Af Amer: >60 mL/min (ref >60)
GFR calc non Af Amer: >60 mL/min (ref >60)
Glucose, Bld: 88 mg/dL (ref 70–99)
Potassium: 3.6 mmol/L (ref 3.5–5.1)
Sodium: 137 mmol/L (ref 135–145)
Total Bilirubin: 0.3 mg/dL (ref 0.3–1.2)
Total Protein: 8.4 g/dL — ABNORMAL HIGH (ref 6.5–8.1)

## 2019-06-16 LAB — WET PREP, GENITAL
Clue Cells Wet Prep HPF POC: NONE SEEN
Sperm: NONE SEEN
Trich, Wet Prep: NONE SEEN
Yeast Wet Prep HPF POC: NONE SEEN

## 2019-06-16 LAB — URINALYSIS, MICROSCOPIC (REFLEX)

## 2019-06-16 LAB — URINALYSIS, ROUTINE W REFLEX MICROSCOPIC
Bilirubin Urine: NEGATIVE
Glucose, UA: NEGATIVE mg/dL
Ketones, ur: NEGATIVE mg/dL
Leukocytes,Ua: NEGATIVE
Nitrite: NEGATIVE
Protein, ur: NEGATIVE mg/dL
Specific Gravity, Urine: 1.025 (ref 1.005–1.030)
pH: 6 (ref 5.0–8.0)

## 2019-06-16 LAB — PREGNANCY, URINE: Preg Test, Ur: NEGATIVE

## 2019-06-16 NOTE — Discharge Instructions (Signed)

## 2019-06-16 NOTE — ED Provider Notes (Signed)
Emergency Department Provider Note   I have reviewed the triage vital signs and the nursing notes.   HISTORY  Chief Complaint Abdominal Pain (lower) and Vaginal Bleeding   HPI Vanessa Cochran is a 23 y.o. female presents to the ED with vaginal bleeding and lower abdominal pain starting last night. Patient questions pregnancy after unprotected intercourse. Denies concern for STD. Denies vaginal discharge. No fever. Vaginal bleeding with light and spotting. Patient with constant lower abdominal pain since the onset of bleeding. No radiation or symptoms or modifying factors.    Past Medical History:  Diagnosis Date  . Asthma     Patient Active Problem List   Diagnosis Date Noted  . Appendicitis 02/16/2015  . Perforated appendicitis 02/16/2015    Past Surgical History:  Procedure Laterality Date  . APPENDECTOMY    . LAPAROSCOPIC APPENDECTOMY N/A 02/15/2015   Procedure: APPENDECTOMY LAPAROSCOPIC,drainage of abcess;  Surgeon: Alphonsa Overall, MD;  Location: [facility];  Service: General;  Laterality: N/A;    Allergies Patient has no known allergies.  No family history on file.  Social History Social History   Tobacco Use  . Smoking status: Current Every Day Smoker    Types: Cigars, Cigarettes  . Smokeless tobacco: Never Used  Substance Use Topics  . Alcohol use: Yes    Comment: daily  . Drug use: No    Review of Systems  Constitutional: No fever/chills Eyes: No visual changes. ENT: No sore throat. Cardiovascular: Denies chest pain. Respiratory: Denies shortness of breath. Gastrointestinal: Positive lower abdominal pain.  No nausea, no vomiting.  No diarrhea.  No constipation. Genitourinary: Negative for dysuria. Positive vaginal bleeding.  Musculoskeletal: Negative for back pain. Skin: Negative for rash. Neurological: Negative for headaches, focal weakness or numbness.  10-point ROS otherwise negative.  ____________________________________________   PHYSICAL  EXAM:  VITAL SIGNS: ED Triage Vitals  Enc Vitals Group     BP 06/16/19 1030 119/76     Pulse Rate 06/16/19 1030 91     Resp 06/16/19 1030 18     Temp 06/16/19 1030 98.2 F (36.8 C)     Temp Source 06/16/19 1030 Oral     SpO2 06/16/19 1030 100 %     Weight 06/16/19 1031 175 lb (79.4 kg)     Height 06/16/19 1031 5\' 10"  (1.778 m)   Constitutional: Alert and oriented. Well appearing and in no acute distress. Eyes: Conjunctivae are normal.  Head: Atraumatic. Nose: No congestion/rhinnorhea. Mouth/Throat: Mucous membranes are moist.  Neck: No stridor.   Cardiovascular: Normal rate, regular rhythm. Good peripheral circulation. Grossly normal heart sounds.   Respiratory: Normal respiratory effort.  No retractions. Lungs CTAB. Gastrointestinal: Soft and nontender. No distention.  Genitourinary: Exam performed with chaperone. Scant vaginal bleeding. No significant discharge.  Musculoskeletal: No lower extremity tenderness nor edema. No gross deformities of extremities. Neurologic:  Normal speech and language. No gross focal neurologic deficits are appreciated.  Skin:  Skin is warm, dry and intact. No rash noted.   ____________________________________________   LABS (all labs ordered are listed, but only abnormal results are displayed)  Labs Reviewed  WET PREP, GENITAL - Abnormal; Notable for the following components:      Result Value   WBC, Wet Prep HPF POC FEW (*)    All other components within normal limits  URINALYSIS, ROUTINE W REFLEX MICROSCOPIC - Abnormal; Notable for the following components:   Hgb urine dipstick TRACE (*)    All other components within normal limits  COMPREHENSIVE METABOLIC  PANEL - Abnormal; Notable for the following components:   Total Protein 8.4 (*)    All other components within normal limits  CBC WITH DIFFERENTIAL/PLATELET - Abnormal; Notable for the following components:   WBC 2.9 (*)    Neutro Abs 1.0 (*)    All other components within normal  limits  URINALYSIS, MICROSCOPIC (REFLEX) - Abnormal; Notable for the following components:   Bacteria, UA RARE (*)    All other components within normal limits  PREGNANCY, URINE  GC/CHLAMYDIA PROBE AMP () NOT AT [facility]   ____________________________________________   PROCEDURES  Procedure(s) performed:   Procedures  None ____________________________________________   INITIAL IMPRESSION / ASSESSMENT AND PLAN / ED COURSE  Pertinent labs & imaging results that were available during my care of the patient were reviewed by me and considered in my medical decision making (see chart for details).   Patient presents to the ED with lower abdominal pain and vaginal bleeding. No fever. Pregnancy negative. No concern for STD per patient. Will wait on test results and treat if positive. No concern for appendicitis or other surgical process as patient has no abdominal tenderness. Labs reviewed. Plan for PCP/OB follow up in the next week if symptoms continue. Return to the ED with worsening pain and/or bleeding.   ____________________________________________  FINAL CLINICAL IMPRESSION(S) / ED DIAGNOSES  Final diagnoses:  Lower abdominal pain  Vaginal bleeding    Note:  This document was prepared using Dragon voice recognition software and may include unintentional dictation errors.  Alona BeneJoshua Long, MD Emergency Medicine    Long, Arlyss RepressJoshua G, MD 06/18/19 [phone]

## 2019-06-16 NOTE — ED Triage Notes (Signed)
Pt reports lower abd pain and vaginal bleeding since last night. Possible pregnant per pt. Also reports lower back pain. sts had unprotected intercourse .

## 2019-06-17 LAB — GC/CHLAMYDIA PROBE AMP (~~LOC~~) NOT AT ARMC
Chlamydia: NEGATIVE
Neisseria Gonorrhea: NEGATIVE

## 2019-10-21 ENCOUNTER — Other Ambulatory Visit: Payer: Self-pay | Admitting: Obstetrics and Gynecology

## 2019-10-21 DIAGNOSIS — N852 Hypertrophy of uterus: Secondary | ICD-10-CM

## 2019-10-21 DIAGNOSIS — R1904 Left lower quadrant abdominal swelling, mass and lump: Secondary | ICD-10-CM

## 2019-10-21 DIAGNOSIS — N926 Irregular menstruation, unspecified: Secondary | ICD-10-CM

## 2019-10-28 ENCOUNTER — Other Ambulatory Visit: Payer: Self-pay

## 2019-11-02 ENCOUNTER — Other Ambulatory Visit: Payer: Self-pay

## 2019-11-02 ENCOUNTER — Ambulatory Visit (INDEPENDENT_AMBULATORY_CARE_PROVIDER_SITE_OTHER): Payer: Self-pay

## 2019-11-02 DIAGNOSIS — N852 Hypertrophy of uterus: Secondary | ICD-10-CM

## 2019-11-02 DIAGNOSIS — R1904 Left lower quadrant abdominal swelling, mass and lump: Secondary | ICD-10-CM

## 2019-11-02 DIAGNOSIS — N926 Irregular menstruation, unspecified: Secondary | ICD-10-CM

## 2019-11-04 ENCOUNTER — Other Ambulatory Visit: Payer: Self-pay

## 2022-03-03 ENCOUNTER — Emergency Department (HOSPITAL_BASED_OUTPATIENT_CLINIC_OR_DEPARTMENT_OTHER)
Admission: EM | Admit: 2022-03-03 | Discharge: 2022-03-03 | Disposition: A | Discharge status: Home / Self Care | Payer: Self-pay | Attending: Emergency Medicine | Admitting: Emergency Medicine

## 2022-03-03 ENCOUNTER — Encounter (HOSPITAL_BASED_OUTPATIENT_CLINIC_OR_DEPARTMENT_OTHER): Payer: Self-pay | Admitting: *Deleted

## 2022-03-03 ENCOUNTER — Other Ambulatory Visit: Payer: Self-pay

## 2022-03-03 ENCOUNTER — Emergency Department (HOSPITAL_BASED_OUTPATIENT_CLINIC_OR_DEPARTMENT_OTHER): Payer: Self-pay

## 2022-03-03 DIAGNOSIS — M25511 Pain in right shoulder: Secondary | ICD-10-CM

## 2022-03-03 DIAGNOSIS — X500XXA Overexertion from strenuous movement or load, initial encounter: Secondary | ICD-10-CM | POA: Insufficient documentation

## 2022-03-03 DIAGNOSIS — Y990 Civilian activity done for income or pay: Secondary | ICD-10-CM | POA: Insufficient documentation

## 2022-03-03 DIAGNOSIS — Y92000 Kitchen of unspecified non-institutional (private) residence as  the place of occurrence of the external cause: Secondary | ICD-10-CM | POA: Insufficient documentation

## 2022-03-03 MED ORDER — DICLOFENAC SODIUM 1 % EX GEL: 2.0000 g | Freq: Four times a day (QID) | CUTANEOUS | 0 refills | Status: DC

## 2022-03-03 MED ORDER — MELOXICAM 7.5 MG PO TABS: 7.5000 mg | ORAL_TABLET | Freq: Every day | ORAL | 0 refills | Status: DC

## 2022-03-03 MED ORDER — KETOROLAC TROMETHAMINE 60 MG/2ML IM SOLN
30.0000 mg | Freq: Once | INTRAMUSCULAR | Status: AC
  Administered 2022-03-03: 30 mg via INTRAMUSCULAR
  Filled 2022-03-03: qty 2

## 2022-03-03 NOTE — ED Provider Notes (Incomplete)
°  [facility] EMERGENCY DEPARTMENT Provider Note   CSN: [id number] Arrival date & time: 03/03/22  1005     History Chief Complaint  Patient presents with   Shoulder Pain    Vanessa Cochran is a 26 y.o. female otherwise healthy presents to the ED for evaluation of right shoulder pain constantly    Shoulder Pain     Home Medications Prior to Admission medications   Medication Sig Start Date End Date Taking? Authorizing Provider  clindamycin (CLINDAGEL) 1 % gel Apply topically 2 (two) times daily. 05/06/18   Mackuen, Courteney Lyn, MD  dicyclomine (BENTYL) 20 MG tablet Take 1 tablet (20 mg total) by mouth 2 (two) times daily as needed for spasms. 02/05/17   Alvira Monday, MD  metroNIDAZOLE (FLAGYL) 500 MG tablet Take 1 tablet (500 mg total) by mouth 2 (two) times daily. 05/06/18   Mackuen, Courteney Lyn, MD  ondansetron (ZOFRAN ODT) 4 MG disintegrating tablet Take 1 tablet (4 mg total) by mouth every 8 (eight) hours as needed for nausea or vomiting. 02/05/17   Alvira Monday, MD      Allergies    Patient has no known allergies.    Review of Systems   Review of Systems  Physical Exam Updated Vital Signs BP 122/77    Pulse 72    Temp 98.7 F (37.1 C) (Oral)    Resp 18    Ht 5\' 10"  (1.778 m)    Wt 77.1 kg    LMP 02/20/2022 (Exact Date)    SpO2 100%    BMI 24.39 kg/m  Physical Exam  ED Results / Procedures / Treatments   Labs (all labs ordered are listed, but only abnormal results are displayed) Labs Reviewed - No data to display  EKG None  Radiology DG Shoulder Right  Result Date: 03/03/2022 CLINICAL DATA:  Pain pop when moving. EXAM: RIGHT SHOULDER - 2+ VIEW COMPARISON:  None. FINDINGS: There is no evidence of fracture or dislocation. There is no evidence of arthropathy or other focal bone abnormality. Soft tissues are unremarkable. IMPRESSION: Negative. Electronically Signed   By: 03/05/2022 D.O.   On: 03/03/2022 10:40    Procedures Procedures  {Document  cardiac monitor, telemetry assessment procedure when appropriate:1}  Medications Ordered in ED Medications - No data to display  ED Course/ Medical Decision Making/ A&P                           Medical Decision Making Amount and/or Complexity of Data Reviewed Radiology: ordered.  Risk Prescription drug management.   ***  {Document critical care time when appropriate:1} {Document review of labs and clinical decision tools ie heart score, Chads2Vasc2 etc:1}  {Document your independent review of radiology images, and any outside records:1} {Document your discussion with family members, caretakers, and with consultants:1} {Document social determinants of health affecting pt's care:1} {Document your decision making why or why not admission, treatments were needed:1} Final Clinical Impression(s) / ED Diagnoses Final diagnoses:  None    Rx / DC Orders ED Discharge Orders     None

## 2022-03-03 NOTE — Discharge Instructions (Addendum)
You were seen here today for evaluation of your right shoulder pain.  Your x-ray is normal.  You may have a shoulder strain or sprain or a tear in her rotator cuff.  For this, I am giving you the names and phone numbers to to orthopedic providers.  I would call both to see who can see you in the soonest.  I am also sending him in 2 medications.  The meloxicam or Mobic you will take once daily for the next 30 days.  The Voltaren gel you will apply to the shoulder up to 4 times daily as needed.  Try gentle stretching and relaxing your arm. ? ?Additionally, I have included the information of  PCP into this chart. Please call to schedule an appointment.  ? ?Contact a doctor if: ?Your pain gets worse. ?Medicine does not help your pain. ?You have new pain in your arm, hand, or fingers. ?Get help right away if: ?Your arm, hand, or fingers: ?Tingle. ?Are numb. ?Are swollen. ?Are painful. ?Turn white or blue. ?

## 2022-03-03 NOTE — ED Notes (Signed)
ED Provider at bedside. 

## 2022-03-03 NOTE — ED Triage Notes (Signed)
C/o rt shoulder pain x 1 month, diff lifting  feels like arm being pulled,  states hears pop when lifting, denies inj     does a lot of lifting at work  ?

## 2023-04-08 ENCOUNTER — Emergency Department (HOSPITAL_BASED_OUTPATIENT_CLINIC_OR_DEPARTMENT_OTHER)
Admission: EM | Admit: 2023-04-08 | Discharge: 2023-04-08 | Disposition: A | Discharge status: Home / Self Care | Payer: Medicaid Other | Attending: Emergency Medicine | Admitting: Emergency Medicine

## 2023-04-08 ENCOUNTER — Other Ambulatory Visit: Payer: Self-pay

## 2023-04-08 DIAGNOSIS — Y990 Civilian activity done for income or pay: Secondary | ICD-10-CM | POA: Insufficient documentation

## 2023-04-08 DIAGNOSIS — X500XXA Overexertion from strenuous movement or load, initial encounter: Secondary | ICD-10-CM | POA: Diagnosis not present

## 2023-04-08 DIAGNOSIS — M5432 Sciatica, left side: Secondary | ICD-10-CM | POA: Insufficient documentation

## 2023-04-08 DIAGNOSIS — M79605 Pain in left leg: Secondary | ICD-10-CM | POA: Diagnosis present

## 2023-04-08 MED ORDER — METHOCARBAMOL 500 MG PO TABS: 500.0000 mg | ORAL_TABLET | Freq: Two times a day (BID) | ORAL | 0 refills | Status: AC

## 2023-04-08 MED ORDER — MELOXICAM 15 MG PO TABS: 15.0000 mg | ORAL_TABLET | Freq: Every day | ORAL | 0 refills | Status: AC

## 2023-04-08 NOTE — Discharge Instructions (Addendum)
Warm compresses to sore muscle for 20 minutes at a time, follow gentle stretching as directed and instructions. Robaxin and Meloxicam as prescribed.  Recheck with PCP if pain persists or see sports medicine as referred.

## 2023-04-08 NOTE — ED Notes (Signed)
Discharge instructions reviewed with patient. Patient verbalizes understanding, no further questions at this time. Medications/prescriptions and follow up information provided. No acute distress noted at time of departure.  

## 2023-04-08 NOTE — ED Provider Notes (Signed)
[place] [facility] AT [facility] Provider Note   CSN: [id number] Arrival date & time: 04/08/23  1537     History  Chief Complaint  Patient presents with   Leg Pain    Vanessa Cochran is a 27 y.o. female.  27 year old female presents with complaint of pain in her left leg.  States pain starts in her thigh, radiates to her knee and down her leg.  Denies any known injuries, no recent falls.  Pain started a few days ago while at work where she does a lot of walking and lifting.  Is worse with movement.  Is taking ibuprofen without improvement in her pain.       Home Medications Prior to Admission medications   Medication Sig Start Date End Date Taking? Authorizing Provider  meloxicam (MOBIC) 15 MG tablet Take 1 tablet (15 mg total) by mouth daily. 04/08/23  Yes Jeannie Fend, PA-C  methocarbamol (ROBAXIN) 500 MG tablet Take 1 tablet (500 mg total) by mouth 2 (two) times daily. 04/08/23  Yes Jeannie Fend, PA-C      Allergies    Patient has no known allergies.    Review of Systems   Review of Systems Negative except as per HPI Physical Exam Updated Vital Signs BP 129/83 (BP Location: Left Arm)   Pulse 77   Temp 98.2 F (36.8 C) (Oral)   Resp 17   Ht  (1.778 m)   Wt 90.7 kg   LMP 04/05/2023   SpO2 100%   BMI 28.70 kg/m  Physical Exam Vitals and nursing note reviewed.  Constitutional:      General: She is not in acute distress.    Appearance: She is well-developed. She is not diaphoretic.  HENT:     Head: Normocephalic and atraumatic.  Cardiovascular:     Pulses: Normal pulses.  Pulmonary:     Effort: Pulmonary effort is normal.  Musculoskeletal:        General: Tenderness present. No swelling or deformity.     Lumbar back: Tenderness present. No spasms or bony tenderness. Normal range of motion. Positive left straight leg raise test. Negative right straight leg raise test.       Back:     Right lower leg: No edema.     Left  lower leg: No edema.       Legs:     Comments: Left SI joint and lumbar paraspinous tenderness Mild left suprapatellar tenderness without crepitus  Skin:    General: Skin is warm and dry.     Findings: No erythema or rash.  Neurological:     Mental Status: She is alert and oriented to person, place, and time.     Sensory: No sensory deficit.     Motor: No weakness.     Gait: Gait normal.     Deep Tendon Reflexes: Reflexes normal.  Psychiatric:        Behavior: Behavior normal.     ED Results / Procedures / Treatments   Labs (all labs ordered are listed, but only abnormal results are displayed) Labs Reviewed - No data to display  EKG None  Radiology No results found.  Procedures Procedures    Medications Ordered in ED Medications - No data to display  ED Course/ Medical Decision Making/ A&P                             Medical Decision Making  27 year old female presents with complaint of pain in her left knee and left thigh, pain radiates from left thigh down left leg without fall or injury.,  On exam is found to have tenderness left SI joint, left lumbar paraspinous as well as a little bit of left suprapatellar tenderness.  No history of trauma, does not need imaging at this time.  Plan is to try muscle relaxant and anti-inflammatory with referral to sports med for follow-up if not improving.  Recommend warm compresses and gentle stretching, provided with directions.        Final Clinical Impression(s) / ED Diagnoses Final diagnoses:  Sciatica of left side    Rx / DC Orders ED Discharge Orders          Ordered    methocarbamol (ROBAXIN) 500 MG tablet  2 times daily        04/08/23 1651    meloxicam (MOBIC) 15 MG tablet  Daily        04/08/23 1651              Jeannie Fend, PA-C 04/08/23 1702    Glyn Ade, MD 04/10/23 1459

## 2023-04-08 NOTE — ED Triage Notes (Signed)
C/O left leg pain for a few days, denies any injury. States certain turn or moves induces shooting pains.

## 2023-09-19 IMAGING — CR DG SHOULDER 2+V*R*
3 series · 3 of 3 positions shown · non-contrast
Comparison: None.

CLINICAL DATA: Pain pop when moving.

EXAM:
RIGHT SHOULDER - 2+ VIEW

[w shoulder grashey right]
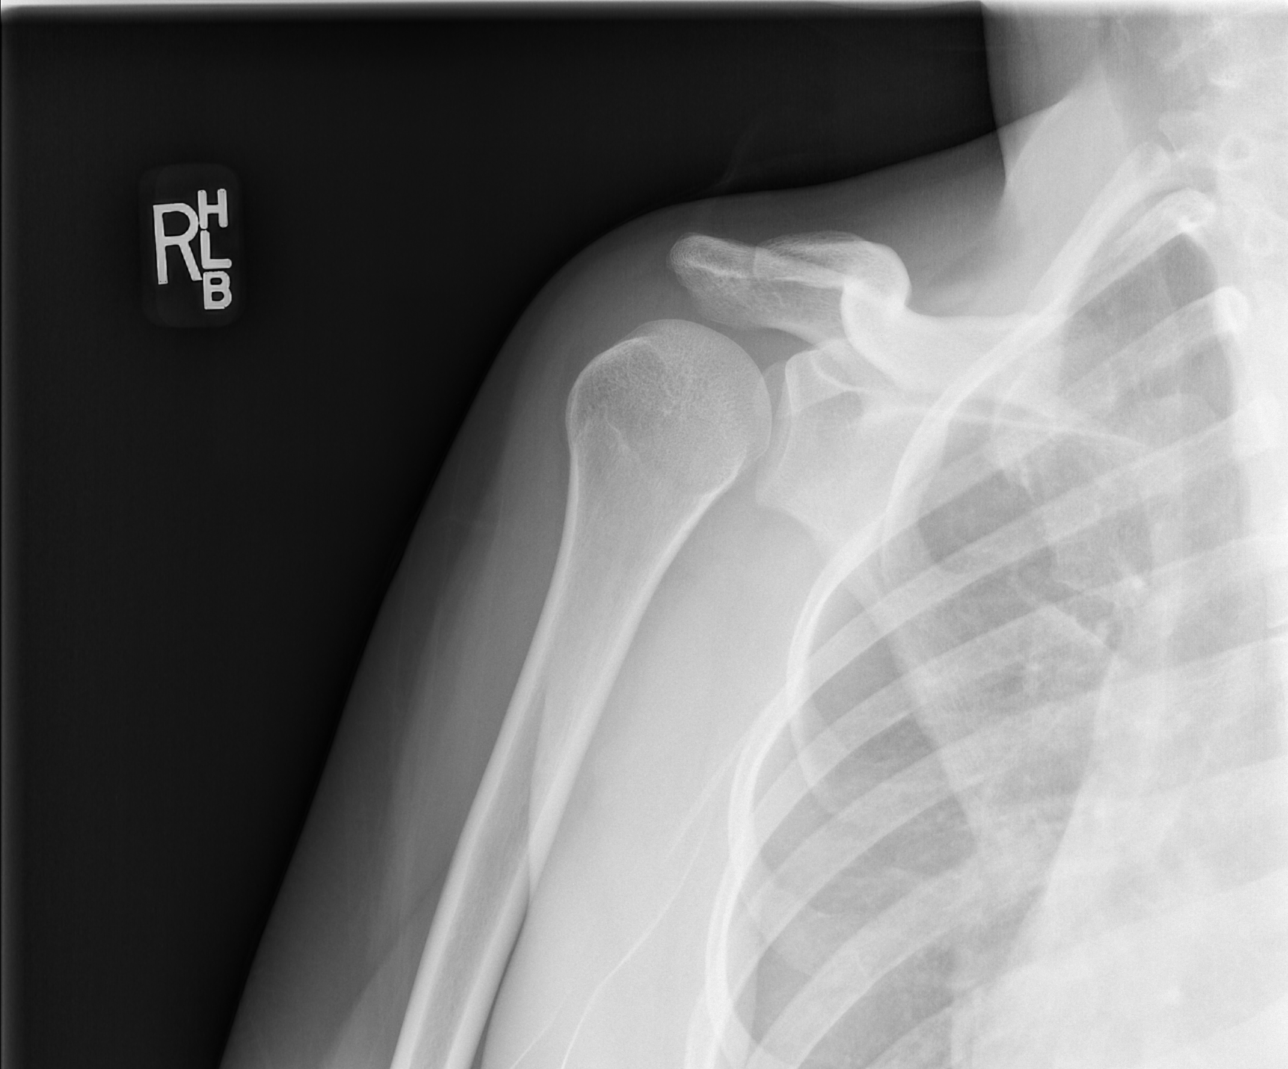

[w shoulder y view right]
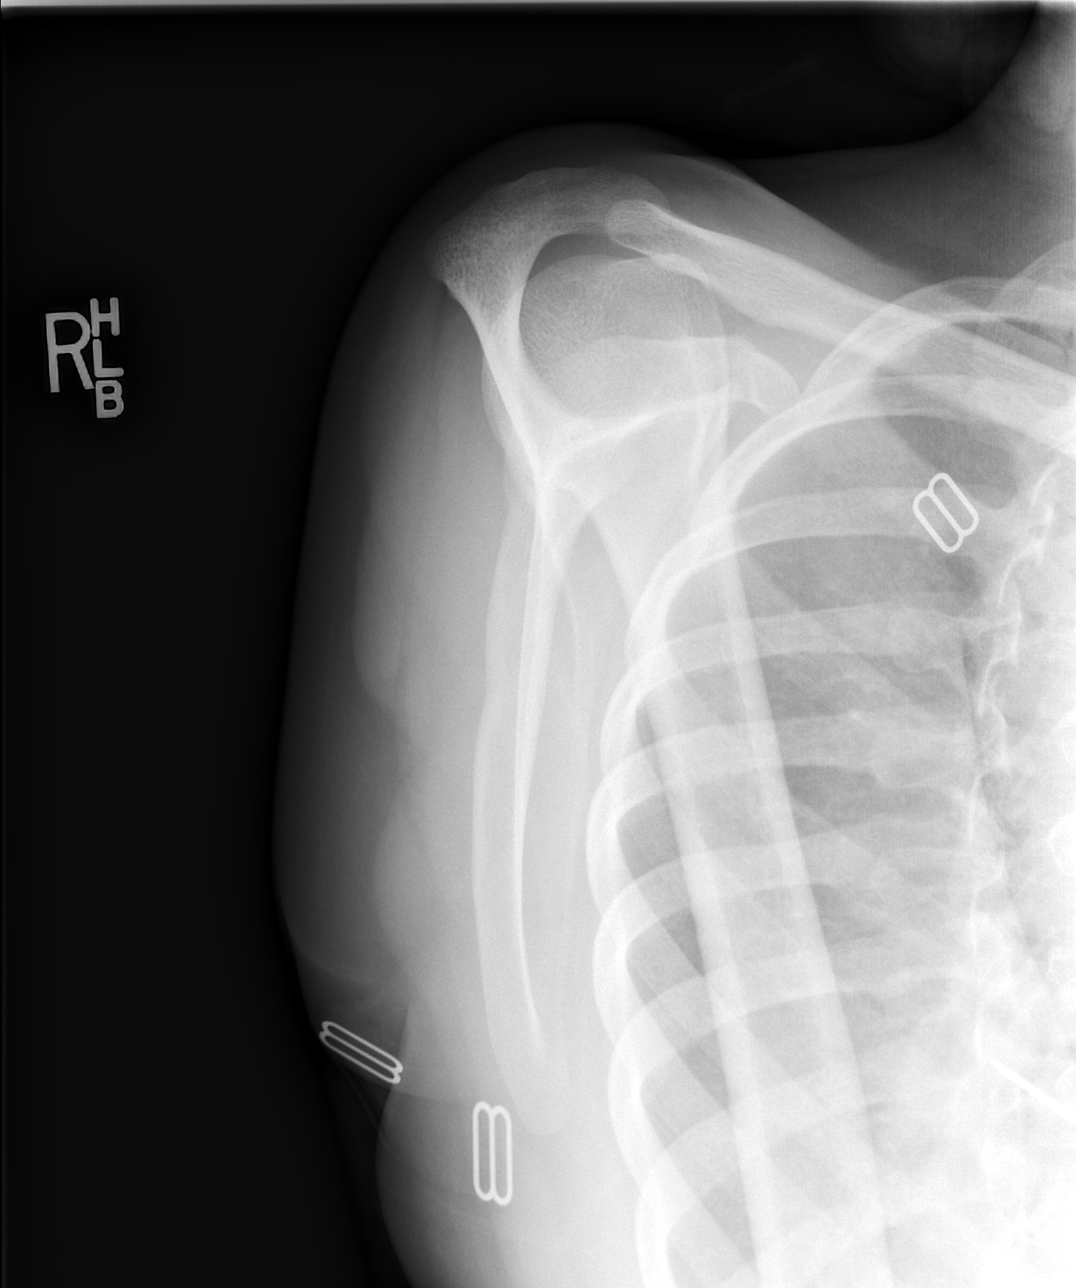

[x shoulder axillary right]
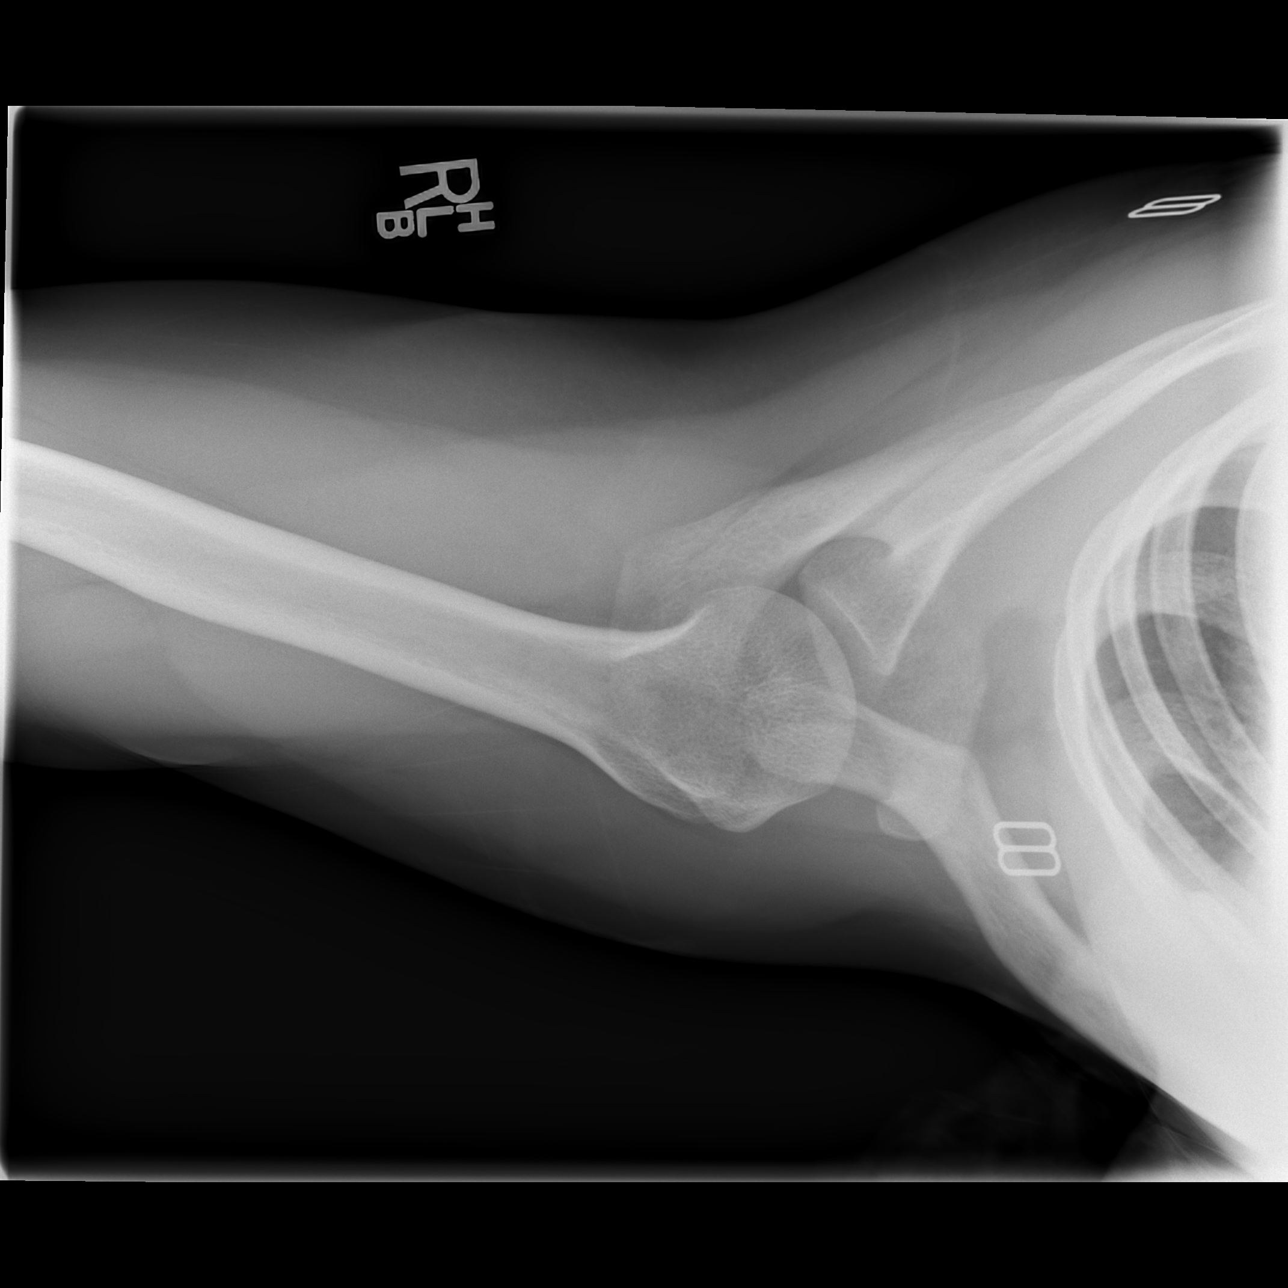

[3 of 3 positions shown; findings below may reference images not displayed]

FINDINGS: There is no evidence of fracture or dislocation. There is no
evidence of arthropathy or other focal bone abnormality. Soft
tissues are unremarkable.
IMPRESSION: Negative.

## 2024-08-20 ENCOUNTER — Emergency Department (HOSPITAL_BASED_OUTPATIENT_CLINIC_OR_DEPARTMENT_OTHER)
Admission: EM | Admit: 2024-08-20 | Discharge: 2024-08-20 | Disposition: A | Discharge status: Home / Self Care | Payer: Self-pay | Attending: Emergency Medicine | Admitting: Emergency Medicine

## 2024-08-20 ENCOUNTER — Encounter (HOSPITAL_BASED_OUTPATIENT_CLINIC_OR_DEPARTMENT_OTHER): Payer: Self-pay

## 2024-08-20 DIAGNOSIS — J45909 Unspecified asthma, uncomplicated: Secondary | ICD-10-CM | POA: Insufficient documentation

## 2024-08-20 DIAGNOSIS — Z202 Contact with and (suspected) exposure to infections with a predominantly sexual mode of transmission: Secondary | ICD-10-CM | POA: Insufficient documentation

## 2024-08-20 LAB — HIV ANTIBODY (ROUTINE TESTING W REFLEX): HIV Screen 4th Generation wRfx: NONREACTIVE

## 2024-08-20 LAB — WET PREP, GENITAL
Clue Cells Wet Prep HPF POC: NONE SEEN
Sperm: NONE SEEN
Trich, Wet Prep: NONE SEEN
WBC, Wet Prep HPF POC: >=10 — AB (ref <10)
Yeast Wet Prep HPF POC: NONE SEEN

## 2024-08-20 LAB — PREGNANCY, URINE: Preg Test, Ur: NEGATIVE

## 2024-08-20 MED ORDER — DOXYCYCLINE HYCLATE 100 MG PO TABS
100.0000 mg | ORAL_TABLET | Freq: Once | ORAL | Status: AC
  Administered 2024-08-20: 100 mg via ORAL
  Filled 2024-08-20: qty 1

## 2024-08-20 MED ORDER — CEFTRIAXONE SODIUM 500 MG IJ SOLR
500.0000 mg | Freq: Once | INTRAMUSCULAR | Status: AC
  Administered 2024-08-20: 500 mg via INTRAMUSCULAR
  Filled 2024-08-20: qty 500

## 2024-08-20 MED ORDER — DOXYCYCLINE HYCLATE 100 MG PO CAPS: 100.0000 mg | ORAL_CAPSULE | Freq: Two times a day (BID) | ORAL | 0 refills | Status: AC

## 2024-08-20 MED ORDER — LIDOCAINE HCL (PF) 1 % IJ SOLN
1.0000 mL | Freq: Once | INTRAMUSCULAR | Status: AC
  Administered 2024-08-20: 1 mL
  Filled 2024-08-20: qty 5

## 2024-08-20 NOTE — ED Provider Notes (Signed)
 Emergency Department Provider Note   I have reviewed the triage vital signs and the nursing notes.   HISTORY  Chief Complaint Exposure to STD   HPI Vanessa Cochran is a 28 y.o. female with past history of asthma presents emergency department for evaluation of STD screening.  She had sexual contact with an individual who has since tested positive for chlamydia.  She states she has some mild vaginal discomfort but no heavy discharge.  No vaginal bleeding.  No back or pelvic pain.  Her last HIV test was in January she believes. Last contact with this individual was July of this year.    Past Medical History:  Diagnosis Date   Asthma     Review of Systems  Constitutional: No fever/chills Cardiovascular: Denies chest pain. Respiratory: Denies shortness of breath. Gastrointestinal: No abdominal pain.  No nausea, no vomiting Musculoskeletal: Negative for back pain. Skin: Negative for rash. Neurological: Negative for headaches.   ____________________________________________   PHYSICAL EXAM:  VITAL SIGNS: ED Triage Vitals  Encounter Vitals Group     BP 08/20/24 0830 (!) 117/96     Pulse Rate 08/20/24 0830 82     Resp 08/20/24 0830 16     Temp 08/20/24 0830 98.2 F (36.8 C)     Temp Source 08/20/24 0830 Oral     SpO2 08/20/24 0830 99 %     Weight 08/20/24 0827 180 lb (81.6 kg)     Height 08/20/24 0827 5' 10 (1.778 m)   Constitutional: Alert and oriented. Well appearing and in no acute distress. Eyes: Conjunctivae are normal.  Head: Atraumatic. Nose: No congestion/rhinnorhea. Mouth/Throat: Mucous membranes are moist.  Neck: No stridor.   Cardiovascular: Normal rate, regular rhythm. Good peripheral circulation. Grossly normal heart sounds.   Respiratory: Normal respiratory effort.  No retractions. Lungs CTAB. Gastrointestinal: Soft and nontender. No distention.  Musculoskeletal: No gross deformities of extremities. Neurologic:  Normal speech and language.  Skin:   Skin is warm, dry and intact. No rash noted.  ____________________________________________   LABS (all labs ordered are listed, but only abnormal results are displayed)  Labs Reviewed  WET PREP, GENITAL - Abnormal; Notable for the following components:      Result Value   WBC, Wet Prep HPF POC >=10 (*)    All other components within normal limits  PREGNANCY, URINE  HIV ANTIBODY (ROUTINE TESTING W REFLEX)  RPR  GC/CHLAMYDIA PROBE AMP ([place]) NOT AT [facility]   ____________________________________________   PROCEDURES  Procedure(s) performed:   Procedures  None  ____________________________________________   INITIAL IMPRESSION / ASSESSMENT AND PLAN / ED COURSE  Pertinent labs & imaging results that were available during my care of the patient were reviewed by me and considered in my medical decision making (see chart for details).   This patient is Presenting for Evaluation of STD screening, which does require a range of treatment options, and is a complaint that involves a moderate risk of morbidity and mortality.  The Differential Diagnoses include STI screen, chlamydia exposure, PID, etc.  Critical Interventions-    Medications  cefTRIAXone  (ROCEPHIN ) injection 500 mg (500 mg Intramuscular Given 08/20/24 0903)  doxycycline  (VIBRA -TABS) tablet 100 mg (100 mg Oral Given 08/20/24 0908)  lidocaine  (PF) (XYLOCAINE ) 1 % injection 1 mL (1 mL Other Given 08/20/24 0903)     Clinical Laboratory Tests Ordered, included pregnancy and wet prep negative.  Gonorrhea/chlamydia along with HIV and RPR will not come back during this ED visit.  Patient to follow  results in the MyChart app which she has on her phone.  Medical Decision Making: Summary:  Patient presents the emergency department with STD screening request.  Known exposure to chlamydia in July.  Plan to treat empirically in the setting with both Rocephin  and doxycycline .  Plan to give first doses of those medicines here.   Patient to self swab with minimal symptoms and no specific pain.  Will send HIV and RPR as well.  Reevaluation with update and discussion with patient.  Plan for doxycycline  given her chlamydia exposure.  She will follow additional STI testing in the MyChart app on her phone.  Discussed abstinence/condom use and full treatment course of Doxy.  Gave contact info for GYN for follow-up.  Patient's presentation is most consistent with acute, uncomplicated illness.   Disposition: discharge  ____________________________________________  FINAL CLINICAL IMPRESSION(S) / ED DIAGNOSES  Final diagnoses:  Possible exposure to STD     NEW OUTPATIENT MEDICATIONS STARTED DURING THIS VISIT:  New Prescriptions   DOXYCYCLINE  (VIBRAMYCIN ) 100 MG CAPSULE    Take 1 capsule (100 mg total) by mouth 2 (two) times daily for 7 days.    Note:  This document was prepared using Dragon voice recognition software and may include unintentional dictation errors.  Fonda Law, MD, [facility] Emergency Medicine    Chrishauna Mee, Fonda MATSU, MD 08/20/24 [phone]

## 2024-08-20 NOTE — Discharge Instructions (Signed)
 I am treating you for possible sexually transmitted infection.  Your test results for this infection will come back in the next couple of days.  I would like for you to complete the doxycycline  prescription given your exposure.  Please avoid sexual contact with others or use condoms 100% of the time until your test results come back and you have been fully treated.  Please follow closely with your GYN.

## 2024-08-20 NOTE — ED Triage Notes (Addendum)
 Pt requesting STD screening because sexual partner apparently has chlamydia. Some vaginal discomfort when using the bathroom.

## 2024-08-21 LAB — RPR: RPR Ser Ql: NONREACTIVE

## 2024-08-23 LAB — GC/CHLAMYDIA PROBE AMP (~~LOC~~) NOT AT ARMC
Chlamydia: NEGATIVE
Comment: NEGATIVE
Comment: NORMAL
Neisseria Gonorrhea: NEGATIVE
